# Patient Record
Sex: Female | Born: 1985
Health system: Southern US, Community
[De-identification: ages and names within clinical notes are randomized; demographics above are authoritative.]

## PROBLEM LIST (undated history)

## (undated) DIAGNOSIS — H93A1 Pulsatile tinnitus, right ear: Secondary | ICD-10-CM

## (undated) DIAGNOSIS — K589 Irritable bowel syndrome without diarrhea: Secondary | ICD-10-CM

## (undated) DIAGNOSIS — Z8249 Family history of ischemic heart disease and other diseases of the circulatory system: Secondary | ICD-10-CM

## (undated) DIAGNOSIS — E559 Vitamin D deficiency, unspecified: Secondary | ICD-10-CM

## (undated) HISTORY — DX: Pulsatile tinnitus, right ear: H93.A1

## (undated) HISTORY — DX: Vitamin D deficiency, unspecified: E55.9

## (undated) HISTORY — PX: TOOTH EXTRACTION: SUR596

## (undated) HISTORY — DX: Family history of ischemic heart disease and other diseases of the circulatory system: Z82.49

## (undated) HISTORY — DX: Irritable bowel syndrome, unspecified: K58.9

---
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
## 2007-10-30 ENCOUNTER — Encounter: Admission: RE | Admit: 2007-10-30 | Discharge: 2007-10-30 | Payer: Self-pay | Admitting: Internal Medicine

## 2009-10-25 ENCOUNTER — Emergency Department (HOSPITAL_COMMUNITY): Admission: EM | Admit: 2009-10-25 | Discharge: 2009-10-26 | Payer: Self-pay | Admitting: Emergency Medicine

## 2009-10-27 ENCOUNTER — Emergency Department (HOSPITAL_COMMUNITY): Admission: EM | Admit: 2009-10-27 | Discharge: 2009-10-27 | Payer: Self-pay | Admitting: Emergency Medicine

## 2010-05-16 NOTE — Assessment & Plan Note (Signed)
[facility]                             PULMONARY OFFICE NOTE   NAME:Eads, Norma White                     MRN:          [id number]  DATE:06/03/2006                            DOB:          January 12, 1985    PROBLEM:  An 25 year old woman, seen at the request of her mother, a  patient here, and her primary physician, Dr. Georgann Housekeeper, for  evaluation of possibly allergic rash.   HISTORY:  This young woman has been in, otherwise, good health.  About 2  weeks ago she noted a single bump on the inner aspect of her right  thigh.  Gradually, the rash has extended diffusely over her neck, back,  and chest, and is spreading out her arms.  It is pruritic.  She has been  using antihistamines including Zyrtec and something that was prescribed  for her, possibly Atarax by description.  She has also been applying a  triamcinolone cream of her mother's.  These give a few hours' relief  from itching.  She has never had any similar problem.  She denies fever,  adenopathy, headache, mucosal irritation, or joint pain, and basically  feels well except for the itching.  Neither she nor her mother is aware  of anything unusual that she may have been exposed to.  She had been on  no medications.  Her mother does use fabric softener sheets in the  dryer, and thinks she might have changed brands but does not remember.   REVIEW OF SYSTEMS:  Limited to itching and rash.   PAST HISTORY:  Essentially negative.  No surgeries, no active health  problems, no problems with latex, contrast dye, or aspirin.   SOCIAL HISTORY:  Smoking 1 black a day.  She is a Consulting civil engineer, not  married, no children.   FAMILY HISTORY:  Mother with allergy and asthma problems, and with  arthritis.   OBJECTIVE:  Weight 150, BP 122/88, pulse regular 81, room air saturation  99%.  This is a generally healthy-appearing, young African-American woman with  light skin.  She has a diffuse, papular,  nonerythematous rash with minimal  excoriation which extends diffusely from her neck across the trunk and  back, and to her upper arms.  Lesions are of different sizes, the  largest being, perhaps, a quarter inch, most around an eighth of an inch  in diameter.  It is not apparent that they are of different ages, and  she has not recognized that any were fading.  Lungs are clear.  Heart sounds regular without murmur or gallop.  There is no enlargement of liver or spleen.  Nose and throat seem clear.  No obvious arthritis.  I see no rash on her palms or nail beds.   IMPRESSION:  Rash.  I favor an infectious etiology over allergy.  A  sustained urticarial reaction seems unlikely unless a trigger can be  identified.  Possibilities might include pityriasis and, I suppose, lues  cannot be excluded.  She has a dermatology appointment in 2 days.  I  encouraged them to keep that and chose to defer blood  work such as a  VDRL and CBC for her dermatologist.   PLAN:  1. Try bathing with Aveeno.  2. Continue antihistamines for comfort.  3. Keep dermatology appointment.  4. Return here p.r.n.     Clinton D. Maple Hudson, MD, Tonny Bollman, FACP  Electronically Signed    CDY/MedQ  DD: 06/03/2006  DT: 06/04/2006  Job #: [id number]   cc:   Georgann Housekeeper, MD

## 2012-10-22 IMAGING — CR DG THORACIC SPINE 2V
4 series · 4 of 4 positions shown · non-contrast
Comparison: 12/21/2005

CLINICAL DATA: Back pain.  Patient lifted a dog 2 weeks ago.  Pain
between shoulder blades since then.

THORACIC SPINE - 2 VIEW

[t t-spine a.p.]
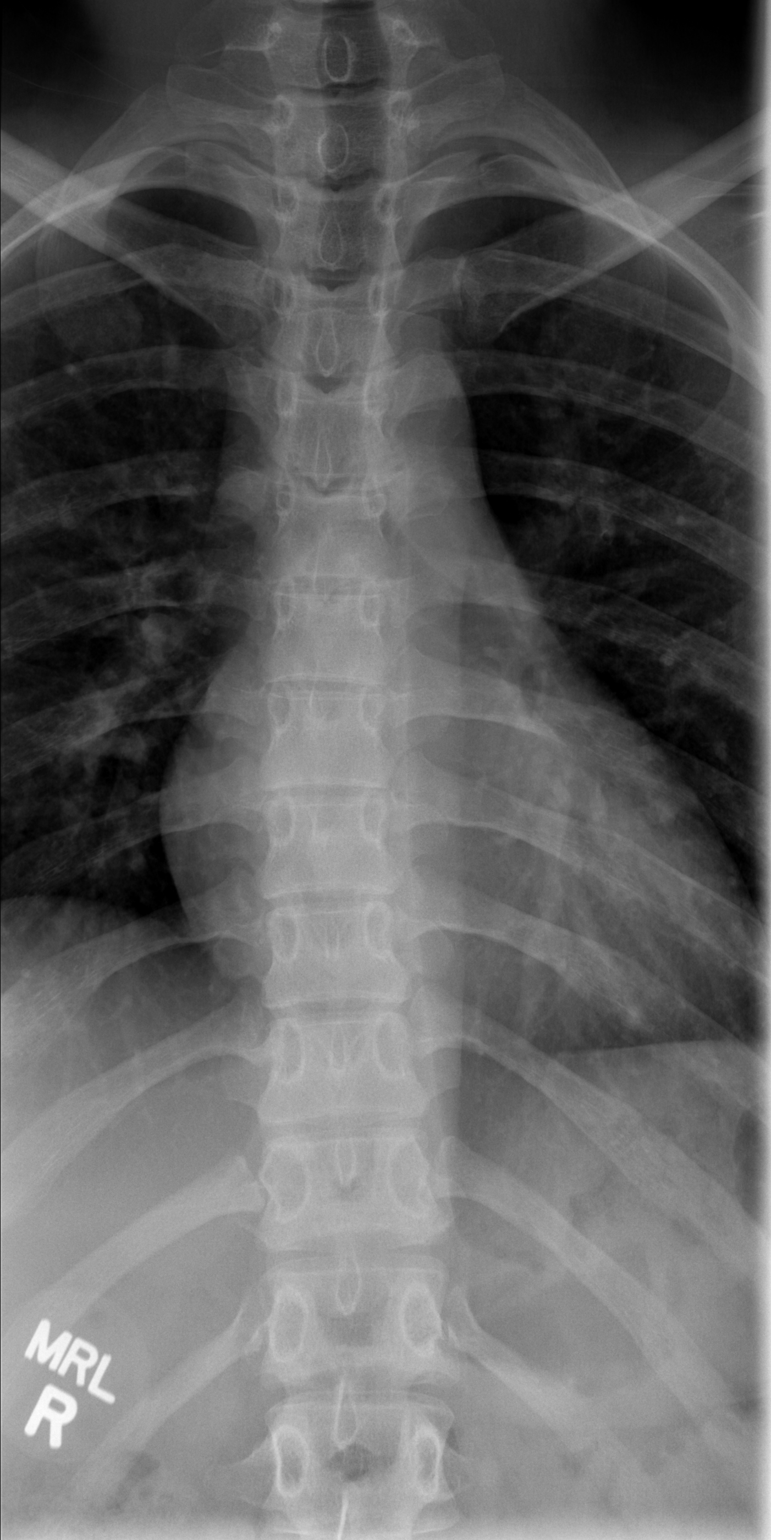

[t t-spine lat (1 of 2)]
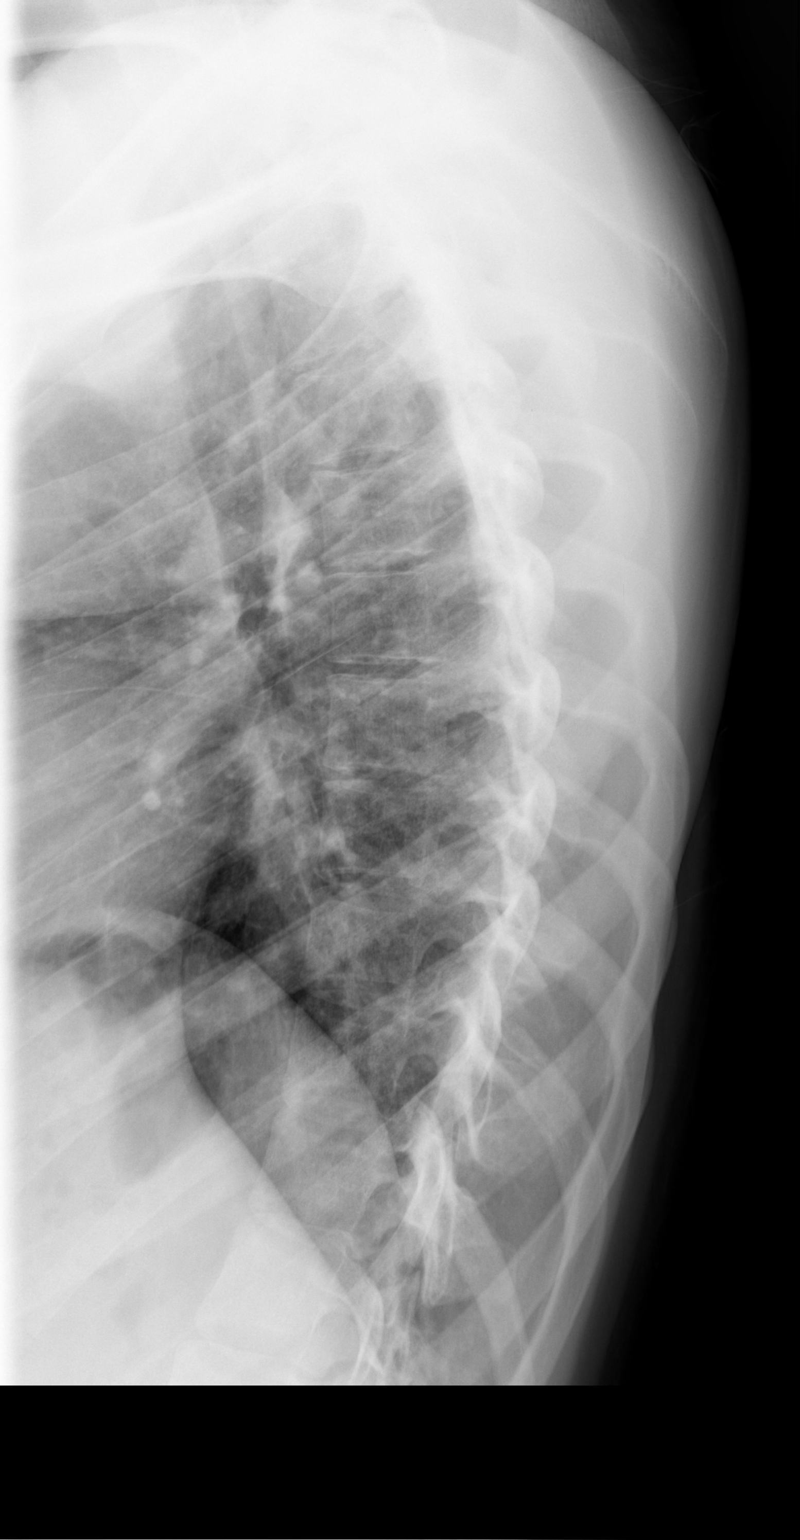

[t t-spine lat (2 of 2)]
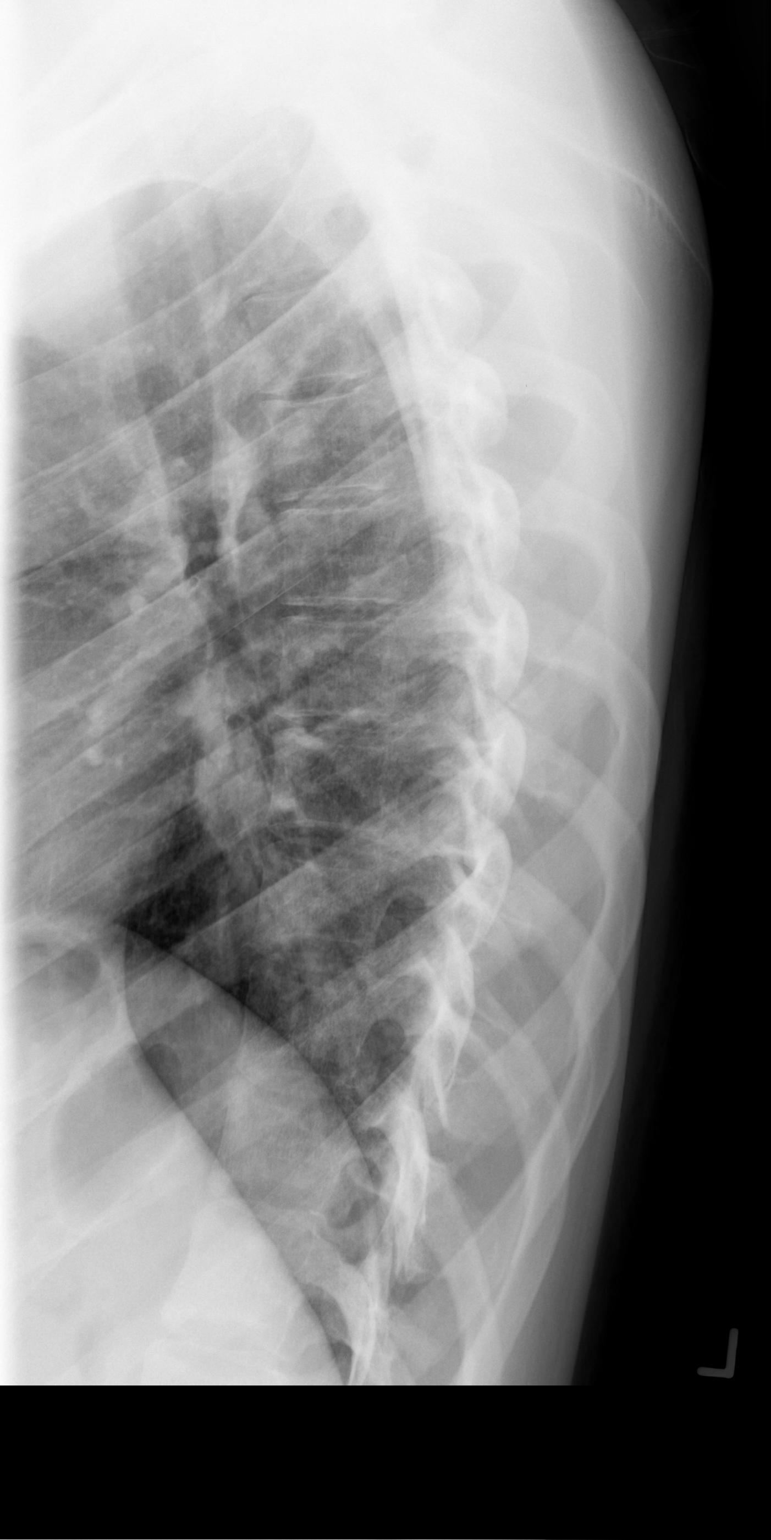

[t swimmers]
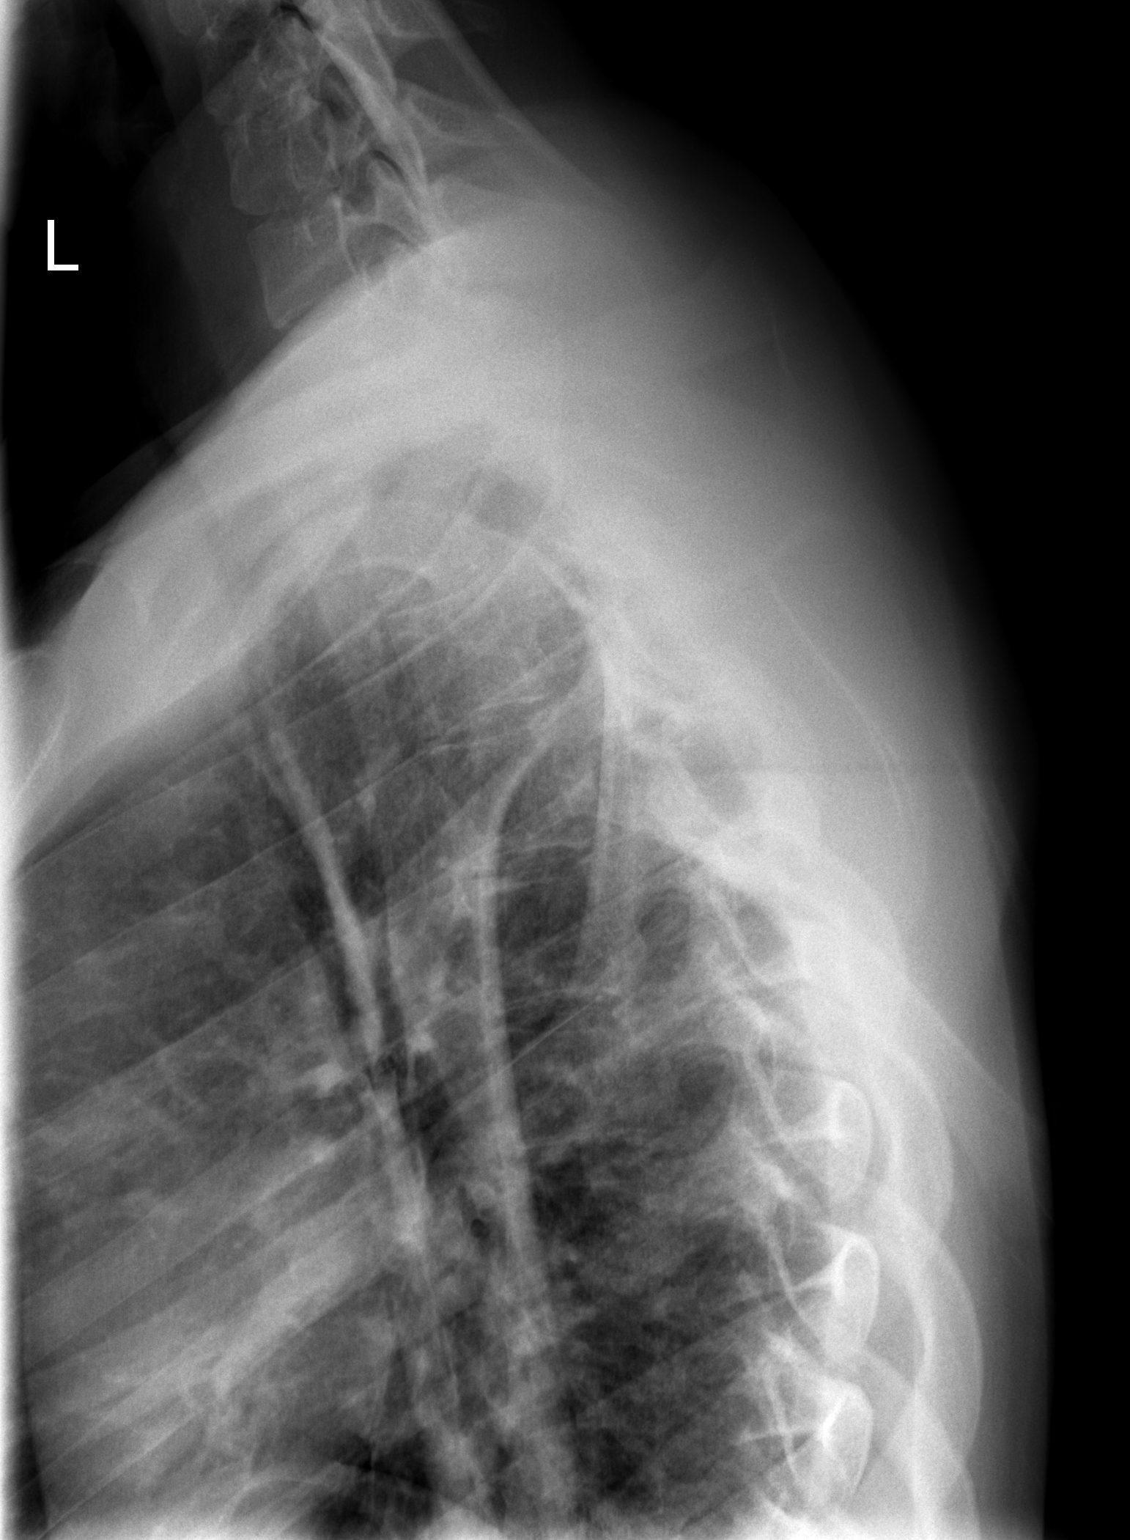

[4 of 4 positions shown; findings below may reference images not displayed]

FINDINGS: There is normal alignment of the thoracic spine.  No
evidence for acute fracture or subluxation.  Paravertebral line has
a normal appearance.  Visualized posterior ribs are unremarkable in
appearance.
IMPRESSION: Negative exam.

## 2016-04-09 DIAGNOSIS — Z6836 Body mass index (BMI) 36.0-36.9, adult: Secondary | ICD-10-CM | POA: Diagnosis not present

## 2016-04-09 DIAGNOSIS — R635 Abnormal weight gain: Secondary | ICD-10-CM | POA: Diagnosis not present

## 2016-04-09 DIAGNOSIS — Z713 Dietary counseling and surveillance: Secondary | ICD-10-CM | POA: Diagnosis not present

## 2016-04-30 DIAGNOSIS — Z713 Dietary counseling and surveillance: Secondary | ICD-10-CM | POA: Diagnosis not present

## 2016-04-30 DIAGNOSIS — Z6835 Body mass index (BMI) 35.0-35.9, adult: Secondary | ICD-10-CM | POA: Diagnosis not present

## 2016-06-04 DIAGNOSIS — Z6834 Body mass index (BMI) 34.0-34.9, adult: Secondary | ICD-10-CM | POA: Diagnosis not present

## 2016-06-04 DIAGNOSIS — Z713 Dietary counseling and surveillance: Secondary | ICD-10-CM | POA: Diagnosis not present

## 2016-07-06 ENCOUNTER — Encounter: Payer: Self-pay | Admitting: Internal Medicine

## 2016-07-06 ENCOUNTER — Ambulatory Visit (INDEPENDENT_AMBULATORY_CARE_PROVIDER_SITE_OTHER): Payer: 59 | Admitting: Internal Medicine

## 2016-07-06 DIAGNOSIS — K580 Irritable bowel syndrome with diarrhea: Secondary | ICD-10-CM

## 2016-07-06 MED ORDER — METRONIDAZOLE 500 MG PO TABS: 500.0000 mg | ORAL_TABLET | Freq: Three times a day (TID) | ORAL | 1 refills | Status: DC

## 2016-07-06 MED ORDER — VITAMIN D3 50 MCG (2000 UT) PO CAPS: 2000.0000 [IU] | ORAL_CAPSULE | Freq: Every day | ORAL | 3 refills | Status: DC

## 2016-07-06 MED ORDER — SACCHAROMYCES BOULARDII 250 MG PO CAPS: 250.0000 mg | ORAL_CAPSULE | Freq: Two times a day (BID) | ORAL | 1 refills | Status: DC

## 2016-07-06 NOTE — Patient Instructions (Addendum)
Gluten free trial (no wheat products) for 4-6 weeks. OK to use gluten-free bread and gluten-free pasta.  Milk free trial (no milk, ice cream, cheese and yogurt) for 4-6 weeks. OK to use almond, coconut, rice or soy milk. "Almond breeze" brand tastes good.  Try Investment banker, operationallorastor or Align after Flagyl course

## 2016-07-06 NOTE — Progress Notes (Signed)
Subjective:  Patient ID: Norma White, female    DOB: January 21, 1985  Age: 31 y.o. MRN: [id number]  CC: No chief complaint on file.   HPI [name] presents for a new pt C/o IBS x years: loose stools. Occ cramps. She has hard time cleaning She tried Metamucil, fiber; lactose-free diet  No outpatient prescriptions prior to visit.   No facility-administered medications prior to visit.     ROS Review of Systems  Constitutional: Negative for activity change, appetite change, chills, fatigue and unexpected weight change.  HENT: Negative for congestion, mouth sores and sinus pressure.   Eyes: Negative for visual disturbance.  Respiratory: Negative for cough and chest tightness.   Gastrointestinal: Positive for abdominal distention, abdominal pain and diarrhea. Negative for nausea.  Genitourinary: Negative for difficulty urinating, frequency and vaginal pain.  Musculoskeletal: Negative for back pain and gait problem.  Skin: Negative for pallor and rash.  Neurological: Negative for dizziness, tremors, weakness, numbness and headaches.  Psychiatric/Behavioral: Negative for confusion and sleep disturbance.    Objective:  BP 116/72 (BP Location: Left Arm, Patient Position: Sitting, Cuff Size: Large)   Pulse 82   Temp 98 F (36.7 C) (Oral)   Ht 5\' 7"  (1.702 m)   Wt 212 lb (96.2 kg)   LMP 06/20/2016 (Approximate)   SpO2 100%   BMI 33.20 kg/m   BP Readings from Last 3 Encounters:  07/06/16 116/72    Wt Readings from Last 3 Encounters:  07/06/16 212 lb (96.2 kg)    Physical Exam  Constitutional: She appears well-developed. No distress.  HENT:  Head: Normocephalic.  Right Ear: External ear normal.  Left Ear: External ear normal.  Nose: Nose normal.  Mouth/Throat: Oropharynx is clear and moist.  Eyes: Pupils are equal, round, and reactive to light. Conjunctivae are normal. Right eye exhibits no discharge. Left eye exhibits no discharge.  Neck: Normal range of motion. Neck  supple. No JVD present. No tracheal deviation present. No thyromegaly present.  Cardiovascular: Normal rate, regular rhythm and normal heart sounds.   Pulmonary/Chest: No stridor. No respiratory distress. She has no wheezes.  Abdominal: Soft. Bowel sounds are normal. She exhibits no distension and no mass. There is no tenderness. There is no rebound and no guarding.  Musculoskeletal: She exhibits no edema or tenderness.  Lymphadenopathy:    She has no cervical adenopathy.  Neurological: She displays normal reflexes. No cranial nerve deficit. She exhibits normal muscle tone. Coordination normal.  Skin: No rash noted. No erythema.  Psychiatric: She has a normal mood and affect. Her behavior is normal. Judgment and thought content normal.    No results found for: WBC, HGB, HCT, PLT, GLUCOSE, CHOL, TRIG, HDL, LDLDIRECT, LDLCALC, ALT, AST, NA, K, CL, CREATININE, BUN, CO2, TSH, PSA, INR, GLUF, HGBA1C, MICROALBUR  Dg Thoracic Spine 1 View  Result Date: 10/27/2009 Clinical Data: Back pain.  Patient lifted a dog 2 weeks ago.  Pain between shoulder blades since then.  THORACIC SPINE - 2 VIEW  Comparison: 12/21/2005  Findings: There is normal alignment of the thoracic spine.  No evidence for acute fracture or subluxation.  Paravertebral line has a normal appearance.  Visualized posterior ribs are unremarkable in appearance.  IMPRESSION: Negative exam. Provider: Sullivan LoneMegan Loy   Assessment & Plan:   There are no diagnoses linked to this encounter. I am having Ms. White maintain her norgestimate-ethinyl estradiol.  Meds ordered this encounter  Medications  . norgestimate-ethinyl estradiol (SPRINTEC 28) 0.25-35 MG-MCG tablet    Sig:  Take 1 tablet by mouth daily.     Follow-up: No Follow-up on file.  Sonda Primes, MD

## 2016-07-09 DIAGNOSIS — L700 Acne vulgaris: Secondary | ICD-10-CM | POA: Diagnosis not present

## 2016-07-23 DIAGNOSIS — K582 Mixed irritable bowel syndrome: Secondary | ICD-10-CM | POA: Diagnosis not present

## 2016-07-30 ENCOUNTER — Ambulatory Visit: Payer: Commercial Managed Care - PPO | Admitting: Internal Medicine

## 2016-09-05 ENCOUNTER — Encounter: Payer: Self-pay | Admitting: Internal Medicine

## 2016-09-05 DIAGNOSIS — K589 Irritable bowel syndrome without diarrhea: Secondary | ICD-10-CM | POA: Insufficient documentation

## 2016-09-05 NOTE — Assessment & Plan Note (Signed)
Gluten free trial (no wheat products) for 4-6 weeks. OK to use gluten-free bread and gluten-free pasta.  Milk free trial (no milk, ice cream, cheese and yogurt) for 4-6 weeks. OK to use almond, coconut, rice or soy milk. "Almond breeze" brand tastes good.  Try Florastor or Align after Flagyl course   GI ref if not better

## 2016-10-25 DIAGNOSIS — Z01419 Encounter for gynecological examination (general) (routine) without abnormal findings: Secondary | ICD-10-CM | POA: Diagnosis not present

## 2016-10-25 DIAGNOSIS — Z6833 Body mass index (BMI) 33.0-33.9, adult: Secondary | ICD-10-CM | POA: Diagnosis not present

## 2017-01-14 ENCOUNTER — Encounter: Payer: Self-pay | Admitting: Internal Medicine

## 2017-01-14 ENCOUNTER — Ambulatory Visit (INDEPENDENT_AMBULATORY_CARE_PROVIDER_SITE_OTHER): Payer: 59 | Admitting: Internal Medicine

## 2017-01-14 ENCOUNTER — Other Ambulatory Visit (INDEPENDENT_AMBULATORY_CARE_PROVIDER_SITE_OTHER): Payer: 59

## 2017-01-14 DIAGNOSIS — K580 Irritable bowel syndrome with diarrhea: Secondary | ICD-10-CM

## 2017-01-14 DIAGNOSIS — Z0000 Encounter for general adult medical examination without abnormal findings: Secondary | ICD-10-CM

## 2017-01-14 LAB — CBC WITH DIFFERENTIAL/PLATELET
BASOS ABS: 0 10*3/uL (ref 0.0–0.1)
BASOS PCT: 0.7 % (ref 0.0–3.0)
Eosinophils Absolute: 0.1 10*3/uL (ref 0.0–0.7)
Eosinophils Relative: 1.4 % (ref 0.0–5.0)
HEMATOCRIT: 39.2 % (ref 36.0–46.0)
Hemoglobin: 12.9 g/dL (ref 12.0–15.0)
LYMPHS ABS: 2.5 10*3/uL (ref 0.7–4.0)
LYMPHS PCT: 39.9 % (ref 12.0–46.0)
MCHC: 32.9 g/dL (ref 30.0–36.0)
MCV: 88.9 fl (ref 78.0–100.0)
Monocytes Absolute: 0.5 10*3/uL (ref 0.1–1.0)
Monocytes Relative: 7.2 % (ref 3.0–12.0)
NEUTROS ABS: 3.2 10*3/uL (ref 1.4–7.7)
NEUTROS PCT: 50.8 % (ref 43.0–77.0)
PLATELETS: 309 10*3/uL (ref 150.0–400.0)
RBC: 4.41 Mil/uL (ref 3.87–5.11)
RDW: 13.8 % (ref 11.5–15.5)
WBC: 6.3 10*3/uL (ref 4.0–10.5)

## 2017-01-14 LAB — BASIC METABOLIC PANEL
BUN: 7 mg/dL (ref 6–23)
CALCIUM: 8.7 mg/dL (ref 8.4–10.5)
CHLORIDE: 107 meq/L (ref 96–112)
CO2: 26 meq/L (ref 19–32)
Creatinine, Ser: 0.61 mg/dL (ref 0.40–1.20)
GFR: 148.72 mL/min (ref >60.00)
GLUCOSE: 84 mg/dL (ref 70–99)
Potassium: 4 mEq/L (ref 3.5–5.1)
SODIUM: 139 meq/L (ref 135–145)

## 2017-01-14 LAB — URINALYSIS
BILIRUBIN URINE: NEGATIVE
HGB URINE DIPSTICK: NEGATIVE
Ketones, ur: NEGATIVE
LEUKOCYTES UA: NEGATIVE
Nitrite: NEGATIVE
PH: 7.5 (ref 5.0–8.0)
Specific Gravity, Urine: 1.02 (ref 1.000–1.030)
Total Protein, Urine: NEGATIVE
UROBILINOGEN UA: 0.2 (ref 0.0–1.0)
Urine Glucose: NEGATIVE

## 2017-01-14 LAB — HEPATIC FUNCTION PANEL
ALK PHOS: 61 U/L (ref 39–117)
ALT: 10 U/L (ref 0–35)
AST: 13 U/L (ref 0–37)
Albumin: 3.7 g/dL (ref 3.5–5.2)
BILIRUBIN TOTAL: 0.3 mg/dL (ref 0.2–1.2)
Bilirubin, Direct: 0.1 mg/dL (ref 0.0–0.3)
Total Protein: 7.3 g/dL (ref 6.0–8.3)

## 2017-01-14 LAB — LIPID PANEL
Cholesterol: 161 mg/dL (ref 0–200)
HDL: 57.1 mg/dL (ref >39.00)
LDL Cholesterol: 92 mg/dL (ref 0–99)
NONHDL: 104.18
Total CHOL/HDL Ratio: 3
Triglycerides: 61 mg/dL (ref 0.0–149.0)
VLDL: 12.2 mg/dL (ref 0.0–40.0)

## 2017-01-14 LAB — TSH: TSH: 0.67 u[IU]/mL (ref 0.35–4.50)

## 2017-01-14 MED ORDER — VITAMIN D3 50 MCG (2000 UT) PO CAPS: 2000.0000 [IU] | ORAL_CAPSULE | Freq: Every day | ORAL | 3 refills | Status: DC

## 2017-01-14 NOTE — Assessment & Plan Note (Signed)
Much better on diet °

## 2017-01-14 NOTE — Progress Notes (Signed)
Subjective:  Patient ID: Norma White, female    DOB: July 12, 1985  Age: 32 y.o. MRN: [id number]  CC: No chief complaint on file.   HPI Norma White presents for a well exam No abd complaints on a better diet  Outpatient Medications Prior to Visit  Medication Sig Dispense Refill  . norgestimate-ethinyl estradiol (SPRINTEC 28) 0.25-35 MG-MCG tablet Take 1 tablet by mouth daily.    . Cholecalciferol (VITAMIN D3) 2000 units capsule Take 1 capsule (2,000 Units total) by mouth daily. (Patient not taking: Reported on 01/14/2017) 100 capsule 3  . metroNIDAZOLE (FLAGYL) 500 MG tablet Take 1 tablet (500 mg total) by mouth 3 (three) times daily. (Patient not taking: Reported on 01/14/2017) 30 tablet 1  . saccharomyces boulardii (FLORASTOR) 250 MG capsule Take 1 capsule (250 mg total) by mouth 2 (two) times daily. Take after you finish Flagyl (Patient not taking: Reported on 01/14/2017) 30 capsule 1   No facility-administered medications prior to visit.     ROS Review of Systems  Constitutional: Positive for unexpected weight change. Negative for activity change, appetite change, chills and fatigue.  HENT: Negative for congestion, mouth sores and sinus pressure.   Eyes: Negative for visual disturbance.  Respiratory: Negative for cough and chest tightness.   Gastrointestinal: Negative for abdominal pain and nausea.  Genitourinary: Negative for difficulty urinating, frequency and vaginal pain.  Musculoskeletal: Negative for back pain and gait problem.  Skin: Negative for pallor and rash.  Neurological: Negative for dizziness, tremors, weakness, numbness and headaches.  Psychiatric/Behavioral: Negative for confusion and sleep disturbance.    Objective:  BP 120/68 (BP Location: Right Arm, Patient Position: Sitting, Cuff Size: Large)   Pulse 62   Temp 98.9 F (37.2 C) (Oral)   Ht 5\' 7"  (1.702 m)   Wt 217 lb (98.4 kg)   SpO2 98%   BMI 33.99 kg/m   BP Readings from Last 3 Encounters:  01/14/17  120/68  07/06/16 116/72    Wt Readings from Last 3 Encounters:  01/14/17 217 lb (98.4 kg)  07/06/16 212 lb (96.2 kg)    Physical Exam  Constitutional: She appears well-developed. No distress.  HENT:  Head: Normocephalic.  Right Ear: External ear normal.  Left Ear: External ear normal.  Nose: Nose normal.  Mouth/Throat: Oropharynx is clear and moist.  Eyes: Conjunctivae are normal. Pupils are equal, round, and reactive to light. Right eye exhibits no discharge. Left eye exhibits no discharge.  Neck: Normal range of motion. Neck supple. No JVD present. No tracheal deviation present. No thyromegaly present.  Cardiovascular: Normal rate, regular rhythm and normal heart sounds.  Pulmonary/Chest: No stridor. No respiratory distress. She has no wheezes.  Abdominal: Soft. Bowel sounds are normal. She exhibits no distension and no mass. There is no tenderness. There is no rebound and no guarding.  Musculoskeletal: She exhibits no edema or tenderness.  Lymphadenopathy:    She has no cervical adenopathy.  Neurological: She displays normal reflexes. No cranial nerve deficit. She exhibits normal muscle tone. Coordination normal.  Skin: No rash noted. No erythema.  Psychiatric: She has a normal mood and affect. Her behavior is normal. Judgment and thought content normal.    No results found for: WBC, HGB, HCT, PLT, GLUCOSE, CHOL, TRIG, HDL, LDLDIRECT, LDLCALC, ALT, AST, NA, K, CL, CREATININE, BUN, CO2, TSH, PSA, INR, GLUF, HGBA1C, MICROALBUR  Dg Thoracic Spine 1 View  Result Date: 10/27/2009 Clinical Data: Back pain.  Patient lifted a dog 2 weeks ago.  Pain between  shoulder blades since then.  THORACIC SPINE - 2 VIEW  Comparison: 12/21/2005  Findings: There is normal alignment of the thoracic spine.  No evidence for acute fracture or subluxation.  Paravertebral line has a normal appearance.  Visualized posterior ribs are unremarkable in appearance.  IMPRESSION: Negative exam. Provider: Sullivan Lone   Assessment & Plan:   There are no diagnoses linked to this encounter. I have discontinued Komal White's metroNIDAZOLE, Vitamin D3, and saccharomyces boulardii. I am also having her maintain her norgestimate-ethinyl estradiol.  No orders of the defined types were placed in this encounter.    Follow-up: No Follow-up on file.  Sonda Primes, MD

## 2017-01-14 NOTE — Assessment & Plan Note (Signed)
We discussed age appropriate health related issues, including available/recomended screening tests and vaccinations. We discussed a need for adhering to healthy diet and exercise. Labs were ordered to be later reviewed . All questions were answered.   

## 2017-01-14 NOTE — Patient Instructions (Signed)
  SebaMed soap    

## 2017-01-18 ENCOUNTER — Telehealth: Payer: Self-pay

## 2017-01-18 NOTE — Telephone Encounter (Signed)
Pt would like to know if there is something OTC that she can take for appetite suppressant/craving control. Please advise

## 2017-01-18 NOTE — Telephone Encounter (Signed)
Try a green tea extract - small bottle Thx

## 2017-01-18 NOTE — Telephone Encounter (Signed)
Pt.notified

## 2017-03-05 ENCOUNTER — Telehealth: Payer: Self-pay

## 2017-03-05 MED ORDER — CRISABOROLE 2 % EX OINT: 1.0000 "application " | TOPICAL_OINTMENT | Freq: Two times a day (BID) | CUTANEOUS | 3 refills | Status: DC

## 2017-03-05 NOTE — Telephone Encounter (Signed)
Patient came in today and would like to know if she can get a RX for [place]. She has been having red, itchy spots on her neck and back. She has tried coco butter and dry skin lotion but it has not helped, please advise.

## 2017-03-05 NOTE — Telephone Encounter (Signed)
Ok  - hope it is covered Visual merchandiserThx

## 2017-03-07 ENCOUNTER — Telehealth: Payer: Self-pay

## 2017-03-07 NOTE — Telephone Encounter (Signed)
Key: [id number]  Submitted today on Cover My Meds. Auth faxed to insurance pending approval.

## 2017-03-15 DIAGNOSIS — L249 Irritant contact dermatitis, unspecified cause: Secondary | ICD-10-CM | POA: Diagnosis not present

## 2017-03-15 DIAGNOSIS — L700 Acne vulgaris: Secondary | ICD-10-CM | POA: Diagnosis not present

## 2017-10-30 DIAGNOSIS — Z6834 Body mass index (BMI) 34.0-34.9, adult: Secondary | ICD-10-CM | POA: Diagnosis not present

## 2017-10-30 DIAGNOSIS — Z01419 Encounter for gynecological examination (general) (routine) without abnormal findings: Secondary | ICD-10-CM | POA: Diagnosis not present

## 2017-11-11 ENCOUNTER — Other Ambulatory Visit (INDEPENDENT_AMBULATORY_CARE_PROVIDER_SITE_OTHER): Payer: 59

## 2017-11-11 ENCOUNTER — Other Ambulatory Visit: Payer: Self-pay

## 2017-11-11 DIAGNOSIS — R8290 Unspecified abnormal findings in urine: Secondary | ICD-10-CM

## 2017-11-11 LAB — URINALYSIS, ROUTINE W REFLEX MICROSCOPIC
Bilirubin Urine: NEGATIVE
Hgb urine dipstick: NEGATIVE
Ketones, ur: NEGATIVE
Leukocytes, UA: NEGATIVE
Nitrite: NEGATIVE
SPECIFIC GRAVITY, URINE: 1.01 (ref 1.000–1.030)
Total Protein, Urine: NEGATIVE
URINE GLUCOSE: NEGATIVE
UROBILINOGEN UA: 1 (ref 0.0–1.0)
pH: 7.5 (ref 5.0–8.0)

## 2017-11-13 ENCOUNTER — Other Ambulatory Visit: Payer: Self-pay | Admitting: Internal Medicine

## 2017-11-13 LAB — URINE CULTURE
MICRO NUMBER:: 91355125
SPECIMEN QUALITY:: ADEQUATE

## 2017-11-13 MED ORDER — CEFUROXIME AXETIL 250 MG PO TABS: 250.0000 mg | ORAL_TABLET | Freq: Two times a day (BID) | ORAL | 1 refills | Status: AC

## 2017-11-13 MED FILL — CEFUROXIME AXETIL 250 MG TA: 250 | 4 days supply | Qty: 8 | Fill #0

## 2017-11-21 ENCOUNTER — Other Ambulatory Visit: Payer: Self-pay | Admitting: Internal Medicine

## 2017-11-21 MED ORDER — CEPHALEXIN 500 MG PO CAPS: 1000.0000 mg | ORAL_CAPSULE | Freq: Two times a day (BID) | ORAL | 0 refills | Status: AC

## 2017-11-21 MED FILL — CEPHALEXIN 500 MG CAPSULE: 500 | 7 days supply | Qty: 28 | Fill #0

## 2017-11-21 NOTE — Progress Notes (Signed)
Deneise, [employer]'ll email a Rx for Keflex. Feel better! AP ===View-only below this line===  ----- Message ----- From: Roney MansGay, Shaniya, CMA Sent: 11/21/2017   7:30 AM EST To: Tresa GarterAleksei Emme Rosenau V, MD  Morning!  Can you send in a stronger antibiotic for me. The Ceftin didn't completely clear up the bladder infection. Thanks!

## 2017-11-25 ENCOUNTER — Telehealth: Payer: Self-pay

## 2017-11-25 MED ORDER — FLUCONAZOLE 150 MG PO TABS: 150.0000 mg | ORAL_TABLET | Freq: Once | ORAL | 1 refills | Status: AC

## 2017-11-25 MED FILL — FLUCONAZOLE 150 MG TABS: 150 | 1 days supply | Qty: 1 | Fill #0

## 2017-11-25 NOTE — Telephone Encounter (Signed)
Done. Thx.

## 2017-11-25 NOTE — Telephone Encounter (Signed)
Pt states she needs something for a yeast infection now. Got one over the weekend.  Please advise

## 2017-11-26 NOTE — Telephone Encounter (Signed)
Pt.notified

## 2018-01-16 ENCOUNTER — Other Ambulatory Visit (INDEPENDENT_AMBULATORY_CARE_PROVIDER_SITE_OTHER): Payer: 59

## 2018-01-16 ENCOUNTER — Encounter: Payer: Self-pay | Admitting: Internal Medicine

## 2018-01-16 ENCOUNTER — Ambulatory Visit (INDEPENDENT_AMBULATORY_CARE_PROVIDER_SITE_OTHER): Payer: 59 | Admitting: Internal Medicine

## 2018-01-16 DIAGNOSIS — Z0000 Encounter for general adult medical examination without abnormal findings: Secondary | ICD-10-CM

## 2018-01-16 LAB — CBC WITH DIFFERENTIAL/PLATELET
BASOS PCT: 1.5 % (ref 0.0–3.0)
Basophils Absolute: 0.1 10*3/uL (ref 0.0–0.1)
EOS PCT: 1.8 % (ref 0.0–5.0)
Eosinophils Absolute: 0.1 10*3/uL (ref 0.0–0.7)
HEMATOCRIT: 39.9 % (ref 36.0–46.0)
HEMOGLOBIN: 13.4 g/dL (ref 12.0–15.0)
Lymphocytes Relative: 44.6 % (ref 12.0–46.0)
Lymphs Abs: 2.1 10*3/uL (ref 0.7–4.0)
MCHC: 33.5 g/dL (ref 30.0–36.0)
MCV: 87.5 fl (ref 78.0–100.0)
MONO ABS: 0.3 10*3/uL (ref 0.1–1.0)
MONOS PCT: 6.8 % (ref 3.0–12.0)
Neutro Abs: 2.1 10*3/uL (ref 1.4–7.7)
Neutrophils Relative %: 45.3 % (ref 43.0–77.0)
Platelets: 287 10*3/uL (ref 150.0–400.0)
RBC: 4.56 Mil/uL (ref 3.87–5.11)
RDW: 13.8 % (ref 11.5–15.5)
WBC: 4.7 10*3/uL (ref 4.0–10.5)

## 2018-01-16 LAB — HEPATIC FUNCTION PANEL
ALT: 21 U/L (ref 0–35)
AST: 21 U/L (ref 0–37)
Albumin: 3.9 g/dL (ref 3.5–5.2)
Alkaline Phosphatase: 54 U/L (ref 39–117)
Bilirubin, Direct: 0.1 mg/dL (ref 0.0–0.3)
TOTAL PROTEIN: 7.5 g/dL (ref 6.0–8.3)
Total Bilirubin: 0.5 mg/dL (ref 0.2–1.2)

## 2018-01-16 LAB — BASIC METABOLIC PANEL
BUN: 11 mg/dL (ref 6–23)
CALCIUM: 9.1 mg/dL (ref 8.4–10.5)
CO2: 22 mEq/L (ref 19–32)
Chloride: 106 mEq/L (ref 96–112)
Creatinine, Ser: 0.75 mg/dL (ref 0.40–1.20)
GFR: 116.37 mL/min (ref >60.00)
Glucose, Bld: 100 mg/dL — ABNORMAL HIGH (ref 70–99)
POTASSIUM: 3.7 meq/L (ref 3.5–5.1)
SODIUM: 138 meq/L (ref 135–145)

## 2018-01-16 LAB — TSH: TSH: 0.81 u[IU]/mL (ref 0.35–4.50)

## 2018-01-16 LAB — LIPID PANEL
Cholesterol: 172 mg/dL (ref 0–200)
HDL: 58.6 mg/dL
LDL Cholesterol: 101 mg/dL — ABNORMAL HIGH (ref 0–99)
NonHDL: 113.54
Total CHOL/HDL Ratio: 3
Triglycerides: 62 mg/dL (ref 0.0–149.0)
VLDL: 12.4 mg/dL (ref 0.0–40.0)

## 2018-01-16 NOTE — Progress Notes (Signed)
Subjective:  Patient ID: Norma White, female    DOB: 1985/02/18  Age: 33 y.o. MRN: [id number]  CC: No chief complaint on file.   HPI [name] presents for well exam  Outpatient Medications Prior to Visit  Medication Sig Dispense Refill  . Cholecalciferol (VITAMIN D3) 2000 units capsule Take 1 capsule (2,000 Units total) by mouth daily. 100 capsule 3  . Crisaborole (EUCRISA) 2 % OINT Apply 1 application topically 2 (two) times daily. 60 g 3  . norgestimate-ethinyl estradiol (SPRINTEC 28) 0.25-35 MG-MCG tablet Take 1 tablet by mouth daily.     No facility-administered medications prior to visit.     ROS: Review of Systems  Constitutional: Negative for activity change, appetite change, chills, fatigue and unexpected weight change.  HENT: Negative for congestion, mouth sores and sinus pressure.   Eyes: Negative for visual disturbance.  Respiratory: Negative for cough and chest tightness.   Gastrointestinal: Negative for abdominal pain and nausea.  Genitourinary: Negative for difficulty urinating, frequency and vaginal pain.  Musculoskeletal: Negative for back pain and gait problem.  Skin: Negative for pallor and rash.  Neurological: Negative for dizziness, tremors, weakness, numbness and headaches.  Psychiatric/Behavioral: Negative for confusion and sleep disturbance.    Objective:  BP 122/74 (BP Location: Left Arm, Patient Position: Sitting, Cuff Size: Large)   Pulse 90   Temp 98.6 F (37 C) (Oral)   Ht 5\' 7"  (1.702 m)   Wt 210 lb (95.3 kg)   SpO2 97%   BMI 32.89 kg/m   BP Readings from Last 3 Encounters:  01/16/18 122/74  01/14/17 120/68  07/06/16 116/72    Wt Readings from Last 3 Encounters:  01/16/18 210 lb (95.3 kg)  01/14/17 217 lb (98.4 kg)  07/06/16 212 lb (96.2 kg)    Physical Exam Constitutional:      General: She is not in acute distress.    Appearance: She is well-developed.  HENT:     Head: Normocephalic.     Right Ear: External ear normal.       Left Ear: External ear normal.     Nose: Nose normal.  Eyes:     General:        Right eye: No discharge.        Left eye: No discharge.     Conjunctiva/sclera: Conjunctivae normal.     Pupils: Pupils are equal, round, and reactive to light.  Neck:     Musculoskeletal: Normal range of motion and neck supple.     Thyroid: No thyromegaly.     Vascular: No JVD.     Trachea: No tracheal deviation.  Cardiovascular:     Rate and Rhythm: Normal rate and regular rhythm.     Heart sounds: Normal heart sounds.  Pulmonary:     Effort: No respiratory distress.     Breath sounds: No stridor. No wheezing.  Abdominal:     General: Bowel sounds are normal. There is no distension.     Palpations: Abdomen is soft. There is no mass.     Tenderness: There is no abdominal tenderness. There is no guarding or rebound.  Musculoskeletal:        General: No tenderness.  Lymphadenopathy:     Cervical: No cervical adenopathy.  Skin:    Findings: No erythema or rash.  Neurological:     Cranial Nerves: No cranial nerve deficit.     Motor: No abnormal muscle tone.     Coordination: Coordination normal.  Deep Tendon Reflexes: Reflexes normal.  Psychiatric:        Behavior: Behavior normal.        Thought Content: Thought content normal.        Judgment: Judgment normal.   neck, thyroid WNL  Lab Results  Component Value Date   WBC 6.3 01/14/2017   HGB 12.9 01/14/2017   HCT 39.2 01/14/2017   PLT 309.0 01/14/2017   GLUCOSE 84 01/14/2017   CHOL 161 01/14/2017   TRIG 61.0 01/14/2017   HDL 57.10 01/14/2017   LDLCALC 92 01/14/2017   ALT 10 01/14/2017   AST 13 01/14/2017   NA 139 01/14/2017   K 4.0 01/14/2017   CL 107 01/14/2017   CREATININE 0.61 01/14/2017   BUN 7 01/14/2017   CO2 26 01/14/2017   TSH 0.67 01/14/2017    Dg Thoracic Spine 1 View  Result Date: 10/27/2009 Clinical Data: Back pain.  Patient lifted a dog 2 weeks ago.  Pain between shoulder blades since then.  THORACIC  SPINE - 2 VIEW  Comparison: 12/21/2005  Findings: There is normal alignment of the thoracic spine.  No evidence for acute fracture or subluxation.  Paravertebral line has a normal appearance.  Visualized posterior ribs are unremarkable in appearance.  IMPRESSION: Negative exam. Provider: Sullivan Lone   Assessment & Plan:   There are no diagnoses linked to this encounter.   No orders of the defined types were placed in this encounter.    Follow-up: No follow-ups on file.  Sonda Primes, MD

## 2018-01-16 NOTE — Assessment & Plan Note (Signed)
We discussed age appropriate health related issues, including available/recomended screening tests and vaccinations. We discussed a need for adhering to healthy diet and exercise. Labs were ordered to be later reviewed . All questions were answered.  tDAP record will be obtained

## 2018-02-11 DIAGNOSIS — H52221 Regular astigmatism, right eye: Secondary | ICD-10-CM | POA: Diagnosis not present

## 2018-02-11 DIAGNOSIS — H5203 Hypermetropia, bilateral: Secondary | ICD-10-CM | POA: Diagnosis not present

## 2018-03-04 ENCOUNTER — Other Ambulatory Visit: Payer: Self-pay

## 2018-03-04 ENCOUNTER — Other Ambulatory Visit (INDEPENDENT_AMBULATORY_CARE_PROVIDER_SITE_OTHER): Payer: 59

## 2018-03-04 ENCOUNTER — Other Ambulatory Visit: Payer: Self-pay | Admitting: Internal Medicine

## 2018-03-04 DIAGNOSIS — R8290 Unspecified abnormal findings in urine: Secondary | ICD-10-CM

## 2018-03-04 LAB — URINALYSIS, ROUTINE W REFLEX MICROSCOPIC
Bilirubin Urine: NEGATIVE
HGB URINE DIPSTICK: NEGATIVE
Ketones, ur: NEGATIVE
LEUKOCYTE UA: NEGATIVE
Nitrite: NEGATIVE
SPECIFIC GRAVITY, URINE: 1.02 (ref 1.000–1.030)
TOTAL PROTEIN, URINE-UPE24: NEGATIVE
URINE GLUCOSE: NEGATIVE
UROBILINOGEN UA: 1 (ref 0.0–1.0)
pH: 6 (ref 5.0–8.0)

## 2018-03-04 MED ORDER — CEFUROXIME AXETIL 250 MG PO TABS: 250.0000 mg | ORAL_TABLET | Freq: Two times a day (BID) | ORAL | 1 refills | Status: DC

## 2018-03-05 ENCOUNTER — Other Ambulatory Visit: Payer: Self-pay | Admitting: Internal Medicine

## 2018-03-05 MED ORDER — CEPHALEXIN 500 MG PO CAPS: 1000.0000 mg | ORAL_CAPSULE | Freq: Two times a day (BID) | ORAL | 1 refills | Status: DC

## 2018-03-05 MED FILL — CEPHALEXIN 500 MG CAPSULE: 500 | 4 days supply | Qty: 16 | Fill #0

## 2018-03-06 LAB — URINE CULTURE
MICRO NUMBER:: 269691
SPECIMEN QUALITY:: ADEQUATE

## 2018-03-11 ENCOUNTER — Other Ambulatory Visit: Payer: Self-pay | Admitting: Internal Medicine

## 2018-03-11 MED ORDER — FLUCONAZOLE 150 MG PO TABS: 150.0000 mg | ORAL_TABLET | Freq: Once | ORAL | 0 refills | Status: AC

## 2018-03-11 MED FILL — FLUCONAZOLE 150 MG TABS: 150 | 1 days supply | Qty: 1 | Fill #0

## 2018-03-20 MED FILL — FEMYNOR 0.25-35 MG-MCG TABS: 0.25-35 | 28 days supply | Qty: 28 | Fill #0

## 2018-04-03 MED FILL — FEMYNOR 0.25-35 MG-MCG TABS: 0.25-35 | 84 days supply | Qty: 84 | Fill #1

## 2018-04-28 MED FILL — CEPHALEXIN 500 MG CAPSULE: 500 | 4 days supply | Qty: 16 | Fill #1

## 2018-06-03 ENCOUNTER — Other Ambulatory Visit: Payer: Self-pay | Admitting: Internal Medicine

## 2018-06-04 ENCOUNTER — Other Ambulatory Visit: Payer: Self-pay

## 2018-06-04 MED ORDER — BENZOYL PEROXIDE 6 % EX MISC: CUTANEOUS | 11 refills | Status: DC

## 2018-06-18 ENCOUNTER — Ambulatory Visit: Payer: Self-pay

## 2018-06-18 ENCOUNTER — Other Ambulatory Visit: Payer: Self-pay

## 2018-06-18 ENCOUNTER — Ambulatory Visit (INDEPENDENT_AMBULATORY_CARE_PROVIDER_SITE_OTHER): Payer: 59 | Admitting: Family Medicine

## 2018-06-18 ENCOUNTER — Encounter: Payer: Self-pay | Admitting: Family Medicine

## 2018-06-18 VITALS — BP 116/74 | HR 69 | Ht 67.0 in

## 2018-06-18 DIAGNOSIS — S62121A Displaced fracture of lunate [semilunar], right wrist, initial encounter for closed fracture: Secondary | ICD-10-CM | POA: Diagnosis not present

## 2018-06-18 DIAGNOSIS — M25531 Pain in right wrist: Secondary | ICD-10-CM

## 2018-06-18 NOTE — Assessment & Plan Note (Signed)
Mostly subluxation with no apparent fracture actually noted.  Responding well long to manipulation.  Discussed topical anti-inflammatories, icing regimen, bracing and taping.  Follow-up next week if not improved

## 2018-06-18 NOTE — Progress Notes (Signed)
[facility] [place], [place] [address] Phone: [phone] Subjective:   Fontaine No, am serving as a scribe for Dr. Hulan Saas.   CC: Right wrist pain  [id number]  Norma White is a 33 y.o. female coming in with complaint of right wrist pain since yesterday. Said she lifted a cast iron skillet. Did feel a pop. Pain over medial wrist but can radiate throughout the entire wrist. Painful with use.  Patient denies any swelling or bruising.  More of a chronic ache.      Past Medical History:  Diagnosis Date  . IBS (irritable bowel syndrome)    Past Surgical History:  Procedure Laterality Date  . TOOTH EXTRACTION     Social History   Socioeconomic History  . Marital status: Married    Spouse name: Not on file  . Number of children: Not on file  . Years of education: Not on file  . Highest education level: Not on file  Occupational History  . Not on file  Social Needs  . Financial resource strain: Not on file  . Food insecurity    Worry: Not on file    Inability: Not on file  . Transportation needs    Medical: Not on file    Non-medical: Not on file  Tobacco Use  . Smoking status: Light Tobacco Smoker  . Smokeless tobacco: Never Used  Substance and Sexual Activity  . Alcohol use: Yes  . Drug use: No  . Sexual activity: Yes    Partners: Male    Birth control/protection: Pill  Lifestyle  . Physical activity    Days per week: Not on file    Minutes per session: Not on file  . Stress: Not on file  Relationships  . Social Herbalist on phone: Not on file    Gets together: Not on file    Attends religious service: Not on file    Active member of club or organization: Not on file    Attends meetings of clubs or organizations: Not on file    Relationship status: Not on file  Other Topics Concern  . Not on file  Social History Narrative  . Not on file   No Known Allergies Family History  Problem  Relation Age of Onset  . Heart attack Mother   . Heart disease Mother   . Hypertension Mother   . Sjogren's syndrome Mother   . Lupus Mother   . GER disease Mother   . Arthritis Mother   . Hypertension Father   . Leukemia Maternal Grandmother   . Epilepsy Maternal Grandfather     Current Outpatient Medications (Endocrine & Metabolic):  .  norgestimate-ethinyl estradiol (SPRINTEC 28) 0.25-35 MG-MCG tablet, Take 1 tablet by mouth daily.      Current Outpatient Medications (Other):  .  Benzoyl Peroxide (BENZEPRO FOAMING CLOTHS) 6 % MISC, Use 1 each BID .  cephALEXin (KEFLEX) 500 MG capsule, Take 2 capsules (1,000 mg total) by mouth 2 (two) times daily. .  Cholecalciferol (VITAMIN D3) 2000 units capsule, Take 1 capsule (2,000 Units total) by mouth daily.    Past medical history, social, surgical and family history all reviewed in electronic medical record.  No pertanent information unless stated regarding to the chief complaint.   Review of Systems:  No headache, visual changes, nausea, vomiting, diarrhea, constipation, dizziness, abdominal pain, skin rash, fevers, chills, night sweats, weight loss, swollen lymph nodes, body aches,  joint swelling, chest pain, shortness of breath, mood changes.  Positive muscle aches  Objective  Blood pressure 116/74, pulse 69, height 5\' 7"  (1.702 m), SpO2 99 %.    General: No apparent distress alert and oriented x3 mood and affect normal, dressed appropriately.  HEENT: Pupils equal, extraocular movements intact  Respiratory: Patient's speak in full sentences and does not appear short of breath  Cardiovascular: No lower extremity edema, non tender, no erythema  Skin: Warm dry intact with no signs of infection or rash on extremities or on axial skeleton.  Abdomen: Soft nontender  Neuro: Cranial nerves II through XII are intact, neurovascularly intact in all extremities with 2+ DTRs and 2+ pulses.  Lymph: No lymphadenopathy of posterior or  anterior cervical chain or axillae bilaterally.  Gait normal with good balance and coordination.  MSK:  Non tender with full range of motion and good stability and symmetric strength and tone of shoulders, elbows, , hip, knee and ankles bilaterally.  Right wrist exam has no significant bruising at this time.  Patient does have some pain over the ulnar aspect of the wrist but also over the lunate bone itself.  Seems to have pain with dorsiflexion.  Good grip strength.  No pain over this scaphoid  Limited musculoskeletal ultrasound was performed and interpreted by Judi SaaZachary M Smith  Limited ultrasound of the right wrist shows the patient may have a subluxation of the lunate noted.  No true though fracture appreciated.  Mild subluxation of the ECU.  Forearm looks unremarkable. Impression: Lunate subluxation   Impression and Recommendations:     This case required medical decision making of moderate complexity. The above documentation has been reviewed and is accurate and complete Judi SaaZachary M Smith, DO       Note: This dictation was prepared with Dragon dictation along with smaller phrase technology. Any transcriptional errors that result from this process are unintentional.

## 2018-06-18 NOTE — Patient Instructions (Signed)
Good to see you  Ice is your friend pennsaid pinkie amount topically 2 times daily as needed.  Wear brace day and night for a week maybe at most.  If not brace tape the wrist like we showed you  Check in with me on Monday

## 2018-08-05 DIAGNOSIS — N911 Secondary amenorrhea: Secondary | ICD-10-CM | POA: Diagnosis not present

## 2018-08-11 DIAGNOSIS — Z3685 Encounter for antenatal screening for Streptococcus B: Secondary | ICD-10-CM | POA: Diagnosis not present

## 2018-08-11 DIAGNOSIS — Z3481 Encounter for supervision of other normal pregnancy, first trimester: Secondary | ICD-10-CM | POA: Diagnosis not present

## 2018-08-11 LAB — OB RESULTS CONSOLE GC/CHLAMYDIA
Chlamydia: NEGATIVE
Gonorrhea: NEGATIVE

## 2018-08-11 LAB — OB RESULTS CONSOLE HIV ANTIBODY (ROUTINE TESTING): HIV: NONREACTIVE

## 2018-08-11 LAB — OB RESULTS CONSOLE ABO/RH: RH Type: POSITIVE

## 2018-08-11 LAB — OB RESULTS CONSOLE HEPATITIS B SURFACE ANTIGEN: Hepatitis B Surface Ag: NEGATIVE

## 2018-08-11 LAB — OB RESULTS CONSOLE RUBELLA ANTIBODY, IGM: Rubella: IMMUNE

## 2018-08-11 LAB — OB RESULTS CONSOLE ANTIBODY SCREEN: Antibody Screen: NEGATIVE

## 2018-08-11 LAB — OB RESULTS CONSOLE RPR: RPR: NONREACTIVE

## 2018-08-28 DIAGNOSIS — Z113 Encounter for screening for infections with a predominantly sexual mode of transmission: Secondary | ICD-10-CM | POA: Diagnosis not present

## 2018-08-28 DIAGNOSIS — Z124 Encounter for screening for malignant neoplasm of cervix: Secondary | ICD-10-CM | POA: Diagnosis not present

## 2018-08-28 DIAGNOSIS — Z3401 Encounter for supervision of normal first pregnancy, first trimester: Secondary | ICD-10-CM | POA: Diagnosis not present

## 2018-08-28 DIAGNOSIS — Z331 Pregnant state, incidental: Secondary | ICD-10-CM | POA: Diagnosis not present

## 2018-09-01 MED FILL — CLINDAMYCIN 2% VAGINAL CRM: 2 | 8 days supply | Qty: 40 | Fill #0

## 2018-10-21 DIAGNOSIS — Z3400 Encounter for supervision of normal first pregnancy, unspecified trimester: Secondary | ICD-10-CM | POA: Diagnosis not present

## 2018-10-21 DIAGNOSIS — Z363 Encounter for antenatal screening for malformations: Secondary | ICD-10-CM | POA: Diagnosis not present

## 2018-10-21 DIAGNOSIS — Z3A18 18 weeks gestation of pregnancy: Secondary | ICD-10-CM | POA: Diagnosis not present

## 2018-11-25 ENCOUNTER — Ambulatory Visit (INDEPENDENT_AMBULATORY_CARE_PROVIDER_SITE_OTHER): Payer: 59 | Admitting: Family Medicine

## 2018-11-25 ENCOUNTER — Ambulatory Visit: Payer: Self-pay

## 2018-11-25 ENCOUNTER — Encounter: Payer: Self-pay | Admitting: Family Medicine

## 2018-11-25 VITALS — BP 120/82 | HR 98 | Ht 67.0 in | Wt 236.0 lb

## 2018-11-25 DIAGNOSIS — G5601 Carpal tunnel syndrome, right upper limb: Secondary | ICD-10-CM | POA: Diagnosis not present

## 2018-11-25 DIAGNOSIS — M25531 Pain in right wrist: Secondary | ICD-10-CM

## 2018-11-25 NOTE — Patient Instructions (Signed)
Good to see you Brace at night See me if you need injection

## 2018-11-25 NOTE — Progress Notes (Signed)
[facility] [address] [place], [place] [address] Phone: [phone] Subjective:    This visit occurred during the SARS-CoV-2 public health emergency.  Safety protocols were in place, including screening questions prior to the visit, additional usage of staff PPE, and extensive cleaning of exam room while observing appropriate contact time as indicated for disinfecting solutions.     CC: Right wrist pain  [id number]   06/18/2018 Mostly subluxation with no apparent fracture actually noted.  Responding well long to manipulation.  Discussed topical anti-inflammatories, icing regimen, bracing and taping.  Follow-up next week if not improved  11/25/2018 Norma White is a 33 y.o. female coming in with complaint of right wrist pain. States she has a tingling sensation in the wrist. Some weakness. Has a wrist brace.   Patient is noticing more tingling recently.  Patient states that it has been some somewhat severe.  Patient is pregnant.  Does not remember any true injury recently.    Past Medical History:  Diagnosis Date  . IBS (irritable bowel syndrome)    Past Surgical History:  Procedure Laterality Date  . TOOTH EXTRACTION     Social History   Socioeconomic History  . Marital status: Married    Spouse name: Not on file  . Number of children: Not on file  . Years of education: Not on file  . Highest education level: Not on file  Occupational History  . Not on file  Social Needs  . Financial resource strain: Not on file  . Food insecurity    Worry: Not on file    Inability: Not on file  . Transportation needs    Medical: Not on file    Non-medical: Not on file  Tobacco Use  . Smoking status: Light Tobacco Smoker  . Smokeless tobacco: Never Used  Substance and Sexual Activity  . Alcohol use: Yes  . Drug use: No  . Sexual activity: Yes    Partners: Male    Birth control/protection: Pill  Lifestyle  . Physical activity    Days per  week: Not on file    Minutes per session: Not on file  . Stress: Not on file  Relationships  . Social Musician on phone: Not on file    Gets together: Not on file    Attends religious service: Not on file    Active member of club or organization: Not on file    Attends meetings of clubs or organizations: Not on file    Relationship status: Not on file  Other Topics Concern  . Not on file  Social History Narrative  . Not on file   No Known Allergies Family History  Problem Relation Age of Onset  . Heart attack Mother   . Heart disease Mother   . Hypertension Mother   . Sjogren's syndrome Mother   . Lupus Mother   . GER disease Mother   . Arthritis Mother   . Hypertension Father   . Leukemia Maternal Grandmother   . Epilepsy Maternal Grandfather     Current Outpatient Medications (Endocrine & Metabolic):  .  norgestimate-ethinyl estradiol (SPRINTEC 28) 0.25-35 MG-MCG tablet, Take 1 tablet by mouth daily.      Current Outpatient Medications (Other):  .  Benzoyl Peroxide (BENZEPRO FOAMING CLOTHS) 6 % MISC, Use 1 each BID .  cephALEXin (KEFLEX) 500 MG capsule, Take 2 capsules (1,000 mg total) by mouth 2 (two) times daily. .  Cholecalciferol (VITAMIN  D3) 2000 units capsule, Take 1 capsule (2,000 Units total) by mouth daily.    Past medical history, social, surgical and family history all reviewed in electronic medical record.  No pertanent information unless stated regarding to the chief complaint.   Review of Systems:  No headache, visual changes, nausea, vomiting, diarrhea, constipation, dizziness, abdominal pain, skin rash, fevers, chills, night sweats, weight loss, swollen lymph nodes, body aches, joint swelling, muscle aches, chest pain, shortness of breath, mood changes.   Objective  Blood pressure 120/82, pulse 98, height 5\' 7"  (1.702 m), weight 236 lb (107 kg), SpO2 97 %. Systems examined below as of    General: No apparent distress alert and  oriented x3 mood and affect normal, dressed appropriately.  HEENT: Pupils equal, extraocular movements intact  Respiratory: Patient's speak in full sentences and does not appear short of breath  Cardiovascular: No lower extremity edema, non tender, no erythema  Skin: Warm dry intact with no signs of infection or rash on extremities or on axial skeleton.  Abdomen: Gravid Neuro: Cranial nerves II through XII are intact, neurovascularly intact in all extremities with 2+ DTRs and 2+ pulses.  Lymph: No lymphadenopathy of posterior or anterior cervical chain or axillae bilaterally.  Gait normal with good balance and coordination.  MSK:  Non tender with full range of motion and good stability and symmetric strength and tone of shoulders, elbows, hip, knee and ankles bilaterally.  Right wrist exam shows the patient does have full range of motion.  Good grip strength.  Positive Tinel and Phalen's on the right.  No pain at the elbow.  Full supination and pronation.  Limited musculoskeletal ultrasound was performed and interpreted by Lyndal Pulley  Limited ultrasound of patient's wrist shows the patient does have mild enlargement with hypoechoic changes of the nerve sheath of the median nerve consistent with mild carpal tunnel.  There is no significant enlargement of the area in diameter.  Patient otherwise fairly unremarkable with no bony abnormalities. Impression: Mild carpal tunnel    Impression and Recommendations:     . The above documentation has been reviewed and is accurate and complete Lyndal Pulley, DO       Note: This dictation was prepared with Dragon dictation along with smaller phrase technology. Any transcriptional errors that result from this process are unintentional.

## 2018-11-25 NOTE — Assessment & Plan Note (Signed)
Right carpal tunnel syndrome.  Discussed bracing at night, discussed ergonomics at work, discussed Tylenol for pain relief if necessary.  Hold on any other medications with patient being pregnant.  Worsening pain unfortunately neck step would be injection.  Patient is in agreement with the plan.  Follow-up with me again 2 to 3 months

## 2018-12-16 DIAGNOSIS — Z3400 Encounter for supervision of normal first pregnancy, unspecified trimester: Secondary | ICD-10-CM | POA: Diagnosis not present

## 2018-12-16 DIAGNOSIS — Z3480 Encounter for supervision of other normal pregnancy, unspecified trimester: Secondary | ICD-10-CM | POA: Diagnosis not present

## 2018-12-16 DIAGNOSIS — Z23 Encounter for immunization: Secondary | ICD-10-CM | POA: Diagnosis not present

## 2019-01-20 ENCOUNTER — Encounter: Payer: Self-pay | Admitting: Internal Medicine

## 2019-01-20 ENCOUNTER — Ambulatory Visit (INDEPENDENT_AMBULATORY_CARE_PROVIDER_SITE_OTHER): Payer: 59 | Admitting: Internal Medicine

## 2019-01-20 ENCOUNTER — Other Ambulatory Visit: Payer: Self-pay

## 2019-01-20 VITALS — BP 124/76 | HR 105 | Temp 98.3°F | Ht 67.0 in | Wt 247.0 lb

## 2019-01-20 DIAGNOSIS — Z3A31 31 weeks gestation of pregnancy: Secondary | ICD-10-CM | POA: Diagnosis not present

## 2019-01-20 DIAGNOSIS — Z0000 Encounter for general adult medical examination without abnormal findings: Secondary | ICD-10-CM | POA: Diagnosis not present

## 2019-01-20 DIAGNOSIS — Z3490 Encounter for supervision of normal pregnancy, unspecified, unspecified trimester: Secondary | ICD-10-CM | POA: Insufficient documentation

## 2019-01-20 LAB — CBC WITH DIFFERENTIAL/PLATELET
Basophils Absolute: 0 10*3/uL (ref 0.0–0.1)
Basophils Relative: 0.4 % (ref 0.0–3.0)
Eosinophils Absolute: 0.1 10*3/uL (ref 0.0–0.7)
Eosinophils Relative: 1.7 % (ref 0.0–5.0)
HCT: 36 % (ref 36.0–46.0)
Hemoglobin: 11.8 g/dL — ABNORMAL LOW (ref 12.0–15.0)
Lymphocytes Relative: 26.6 % (ref 12.0–46.0)
Lymphs Abs: 2.3 10*3/uL (ref 0.7–4.0)
MCHC: 32.9 g/dL (ref 30.0–36.0)
MCV: 89.2 fl (ref 78.0–100.0)
Monocytes Absolute: 0.6 10*3/uL (ref 0.1–1.0)
Monocytes Relative: 6.6 % (ref 3.0–12.0)
Neutro Abs: 5.6 10*3/uL (ref 1.4–7.7)
Neutrophils Relative %: 64.7 % (ref 43.0–77.0)
Platelets: 300 10*3/uL (ref 150.0–400.0)
RBC: 4.04 Mil/uL (ref 3.87–5.11)
RDW: 13.9 % (ref 11.5–15.5)
WBC: 8.6 10*3/uL (ref 4.0–10.5)

## 2019-01-20 LAB — HEPATIC FUNCTION PANEL
ALT: 12 U/L (ref 0–35)
AST: 16 U/L (ref 0–37)
Albumin: 3.2 g/dL — ABNORMAL LOW (ref 3.5–5.2)
Alkaline Phosphatase: 84 U/L (ref 39–117)
Bilirubin, Direct: 0.1 mg/dL (ref 0.0–0.3)
Total Bilirubin: 0.2 mg/dL (ref 0.2–1.2)
Total Protein: 6.8 g/dL (ref 6.0–8.3)

## 2019-01-20 LAB — BASIC METABOLIC PANEL
BUN: 7 mg/dL (ref 6–23)
CO2: 23 mEq/L (ref 19–32)
Calcium: 8.5 mg/dL (ref 8.4–10.5)
Chloride: 107 mEq/L (ref 96–112)
Creatinine, Ser: 0.57 mg/dL (ref 0.40–1.20)
GFR: 123.38 mL/min (ref >60.00)
Glucose, Bld: 74 mg/dL (ref 70–99)
Potassium: 3.5 mEq/L (ref 3.5–5.1)
Sodium: 138 mEq/L (ref 135–145)

## 2019-01-20 LAB — LIPID PANEL
Cholesterol: 202 mg/dL — ABNORMAL HIGH (ref 0–200)
HDL: 67.5 mg/dL (ref >39.00)
LDL Cholesterol: 114 mg/dL — ABNORMAL HIGH (ref 0–99)
NonHDL: 134.83
Total CHOL/HDL Ratio: 3
Triglycerides: 105 mg/dL (ref 0.0–149.0)
VLDL: 21 mg/dL (ref 0.0–40.0)

## 2019-01-20 LAB — URINALYSIS
Bilirubin Urine: NEGATIVE
Hgb urine dipstick: NEGATIVE
Ketones, ur: NEGATIVE
Leukocytes,Ua: NEGATIVE
Nitrite: NEGATIVE
Specific Gravity, Urine: 1.015 (ref 1.000–1.030)
Total Protein, Urine: NEGATIVE
Urine Glucose: NEGATIVE
Urobilinogen, UA: 0.2 (ref 0.0–1.0)
pH: 7 (ref 5.0–8.0)

## 2019-01-20 LAB — TSH: TSH: 0.9 u[IU]/mL (ref 0.35–4.50)

## 2019-01-20 NOTE — Assessment & Plan Note (Signed)
We discussed age appropriate health related issues, including available/recomended screening tests and vaccinations. We discussed a need for adhering to healthy diet and exercise. Labs were ordered to be later reviewed . All questions were answered. [redacted] weeks pregnant - Dr Henderson Cloud

## 2019-01-20 NOTE — Progress Notes (Signed)
   Subjective:  Patient ID: Norma White, female    DOB: 05/27/1985  Age: 34 y.o. MRN: [id number]  CC: No chief complaint on file.   HPI Claretta Kendra presents for a well exam [redacted] weeks pregnant  Outpatient Medications Prior to Visit  Medication Sig Dispense Refill  . Prenatal MV-Min-FA-Omega-3 (PRENATAL GUMMIES/DHA & FA) 0.4-32.5 MG CHEW Prenatal Gummies    . Benzoyl Peroxide (BENZEPRO FOAMING CLOTHS) 6 % MISC Use 1 each BID 60 each 11  . cephALEXin (KEFLEX) 500 MG capsule Take 2 capsules (1,000 mg total) by mouth 2 (two) times daily. 16 capsule 1  . Cholecalciferol (VITAMIN D3) 2000 units capsule Take 1 capsule (2,000 Units total) by mouth daily. 100 capsule 3  . norgestimate-ethinyl estradiol (SPRINTEC 28) 0.25-35 MG-MCG tablet Take 1 tablet by mouth daily.     No facility-administered medications prior to visit.    ROS: Review of Systems  Objective:  BP 124/76 (BP Location: Left Arm, Patient Position: Sitting, Cuff Size: Large)   Pulse (!) 105   Temp 98.3 F (36.8 C) (Oral)   Ht 5\' 7"  (1.702 m)   Wt 247 lb (112 kg)   SpO2 96%   BMI 38.69 kg/m   BP Readings from Last 3 Encounters:  01/20/19 124/76  11/25/18 120/82  06/18/18 116/74    Wt Readings from Last 3 Encounters:  01/20/19 247 lb (112 kg)  11/25/18 236 lb (107 kg)  01/16/18 210 lb (95.3 kg)    Physical Exam  Lab Results  Component Value Date   WBC 4.7 01/16/2018   HGB 13.4 01/16/2018   HCT 39.9 01/16/2018   PLT 287.0 01/16/2018   GLUCOSE 100 (H) 01/16/2018   CHOL 172 01/16/2018   TRIG 62.0 01/16/2018   HDL 58.60 01/16/2018   LDLCALC 101 (H) 01/16/2018   ALT 21 01/16/2018   AST 21 01/16/2018   NA 138 01/16/2018   K 3.7 01/16/2018   CL 106 01/16/2018   CREATININE 0.75 01/16/2018   BUN 11 01/16/2018   CO2 22 01/16/2018   TSH 0.81 01/16/2018    DG Thoracic Spine 1 View  Result Date: 10/27/2009 Clinical Data: Back pain.  Patient lifted a dog 2 weeks ago.  Pain between shoulder blades since  then.  THORACIC SPINE - 2 VIEW  Comparison: 12/21/2005  Findings: There is normal alignment of the thoracic spine.  No evidence for acute fracture or subluxation.  Paravertebral line has a normal appearance.  Visualized posterior ribs are unremarkable in appearance.  IMPRESSION: Negative exam. Provider: 12/23/2005   Assessment & Plan:   There are no diagnoses linked to this encounter.   No orders of the defined types were placed in this encounter.    Follow-up: No follow-ups on file.  Sullivan Lone, MD

## 2019-01-20 NOTE — Assessment & Plan Note (Addendum)
Prenatal MVI

## 2019-02-18 DIAGNOSIS — Z3685 Encounter for antenatal screening for Streptococcus B: Secondary | ICD-10-CM | POA: Diagnosis not present

## 2019-02-18 LAB — OB RESULTS CONSOLE GBS: GBS: NEGATIVE

## 2019-03-05 ENCOUNTER — Telehealth (HOSPITAL_COMMUNITY): Payer: Self-pay | Admitting: *Deleted

## 2019-03-05 ENCOUNTER — Encounter (HOSPITAL_COMMUNITY): Payer: Self-pay | Admitting: *Deleted

## 2019-03-05 NOTE — Telephone Encounter (Signed)
Preadmission screen  

## 2019-03-06 ENCOUNTER — Encounter (HOSPITAL_COMMUNITY): Payer: Self-pay | Admitting: *Deleted

## 2019-03-16 ENCOUNTER — Other Ambulatory Visit (HOSPITAL_COMMUNITY)
Admission: RE | Admit: 2019-03-16 | Discharge: 2019-03-16 | Disposition: A | Discharge status: Home / Self Care | Payer: 59 | Source: Ambulatory Visit | Attending: Obstetrics and Gynecology | Admitting: Obstetrics and Gynecology

## 2019-03-16 DIAGNOSIS — Z87891 Personal history of nicotine dependence: Secondary | ICD-10-CM | POA: Diagnosis not present

## 2019-03-16 DIAGNOSIS — Z3A39 39 weeks gestation of pregnancy: Secondary | ICD-10-CM | POA: Diagnosis not present

## 2019-03-16 DIAGNOSIS — Z20822 Contact with and (suspected) exposure to covid-19: Secondary | ICD-10-CM | POA: Diagnosis not present

## 2019-03-16 LAB — SARS CORONAVIRUS 2 (TAT 6-24 HRS): SARS Coronavirus 2: NEGATIVE

## 2019-03-17 NOTE — H&P (Signed)
Norma White is a 34 y.o. female presenting for IOL at term. Pregnancy uncomplicated.  OB History    Gravida  1   Para      Term      Preterm      AB      Living        SAB      TAB      Ectopic      Multiple      Live Births             Past Medical History:  Diagnosis Date  . IBS (irritable bowel syndrome)    Past Surgical History:  Procedure Laterality Date  . TOOTH EXTRACTION     Family History: family history includes Arthritis in her mother; Epilepsy in her maternal grandfather; GER disease in her mother; Heart attack in her mother; Heart disease in her mother; Hypertension in her father and mother; Leukemia in her maternal grandmother; Lupus in her mother; Seizures in her father; Sjogren's syndrome in her mother. Social History:  reports that she quit smoking about a year ago. She has never used smokeless tobacco. She reports current alcohol use. She reports that she does not use drugs.     Maternal Diabetes: No Genetic Screening: Declined Maternal Ultrasounds/Referrals: Normal Fetal Ultrasounds or other Referrals:  None Maternal Substance Abuse:  No Significant Maternal Medications:  None Significant Maternal Lab Results:  Group B Strep negative Other Comments:  None  Review of Systems  Eyes: Negative for visual disturbance.  Gastrointestinal: Negative for abdominal pain.  Neurological: Negative for headaches.   Maternal Medical History:  Fetal activity: Perceived fetal activity is normal.        Last menstrual period 06/14/2018. Maternal Exam:  Abdomen: Fetal presentation: vertex     Physical Exam  Cardiovascular: Normal rate.  Respiratory: Effort normal.  GI: Soft.    Cx 0/30%/-3 in office 03/11/19  Prenatal labs: ABO, Rh: O/Positive/-- (08/10 0000) Antibody: Negative (08/10 0000) Rubella: Immune (08/10 0000) RPR: Nonreactive (08/10 0000)  HBsAg: Negative (08/10 0000)  HIV: Non-reactive (08/10 0000)  GBS: Negative/-- (02/17  0000)   Assessment/Plan: 34 yo G1P0 at term for two stage IOL   Roselle Locus II 03/17/2019, 5:48 PM

## 2019-03-18 ENCOUNTER — Encounter (HOSPITAL_COMMUNITY): Payer: Self-pay | Admitting: Obstetrics and Gynecology

## 2019-03-18 ENCOUNTER — Inpatient Hospital Stay (HOSPITAL_COMMUNITY): Payer: 59

## 2019-03-18 ENCOUNTER — Other Ambulatory Visit: Payer: Self-pay

## 2019-03-18 ENCOUNTER — Inpatient Hospital Stay (HOSPITAL_COMMUNITY)
Admission: AD | Admit: 2019-03-18 | Discharge: 2019-03-20 | DRG: 807 | Disposition: A | Discharge status: Home / Self Care | Payer: 59 | Attending: Obstetrics and Gynecology | Admitting: Obstetrics and Gynecology

## 2019-03-18 ENCOUNTER — Inpatient Hospital Stay (HOSPITAL_COMMUNITY): Payer: 59 | Admitting: Anesthesiology

## 2019-03-18 DIAGNOSIS — Z20822 Contact with and (suspected) exposure to covid-19: Secondary | ICD-10-CM | POA: Diagnosis present

## 2019-03-18 DIAGNOSIS — Z3A39 39 weeks gestation of pregnancy: Secondary | ICD-10-CM

## 2019-03-18 DIAGNOSIS — O26893 Other specified pregnancy related conditions, third trimester: Secondary | ICD-10-CM | POA: Diagnosis present

## 2019-03-18 DIAGNOSIS — Z3A00 Weeks of gestation of pregnancy not specified: Secondary | ICD-10-CM | POA: Diagnosis not present

## 2019-03-18 DIAGNOSIS — Z3490 Encounter for supervision of normal pregnancy, unspecified, unspecified trimester: Secondary | ICD-10-CM

## 2019-03-18 DIAGNOSIS — Z87891 Personal history of nicotine dependence: Secondary | ICD-10-CM | POA: Diagnosis not present

## 2019-03-18 DIAGNOSIS — O43819 Placental infarction, unspecified trimester: Secondary | ICD-10-CM | POA: Diagnosis not present

## 2019-03-18 LAB — CBC
HCT: 37.4 % (ref 36.0–46.0)
Hemoglobin: 11.9 g/dL — ABNORMAL LOW (ref 12.0–15.0)
MCH: 29.2 pg (ref 26.0–34.0)
MCHC: 31.8 g/dL (ref 30.0–36.0)
MCV: 91.7 fL (ref 80.0–100.0)
Platelets: 312 10*3/uL (ref 150–400)
RBC: 4.08 MIL/uL (ref 3.87–5.11)
RDW: 14.2 % (ref 11.5–15.5)
WBC: 9.8 10*3/uL (ref 4.0–10.5)
nRBC: 0 % (ref 0.0–0.2)

## 2019-03-18 LAB — TYPE AND SCREEN
ABO/RH(D): O POS
Antibody Screen: NEGATIVE

## 2019-03-18 LAB — RPR: RPR Ser Ql: NONREACTIVE

## 2019-03-18 LAB — ABO/RH: ABO/RH(D): O POS

## 2019-03-18 MED ORDER — PHENYLEPHRINE 40 MCG/ML (10ML) SYRINGE FOR IV PUSH (FOR BLOOD PRESSURE SUPPORT): 80.0000 ug | PREFILLED_SYRINGE | INTRAVENOUS | Status: DC | PRN

## 2019-03-18 MED ORDER — ACETAMINOPHEN 325 MG PO TABS: 650.0000 mg | ORAL_TABLET | ORAL | Status: DC | PRN

## 2019-03-18 MED ORDER — TERBUTALINE SULFATE 1 MG/ML IJ SOLN: 0.2500 mg | Freq: Once | INTRAMUSCULAR | Status: DC | PRN

## 2019-03-18 MED ORDER — SOD CITRATE-CITRIC ACID 500-334 MG/5ML PO SOLN: 30.0000 mL | ORAL | Status: DC | PRN

## 2019-03-18 MED ORDER — ONDANSETRON HCL 4 MG/2ML IJ SOLN: 4.0000 mg | Freq: Four times a day (QID) | INTRAMUSCULAR | Status: DC | PRN

## 2019-03-18 MED ORDER — OXYTOCIN 40 UNITS IN NORMAL SALINE INFUSION - SIMPLE MED
1.0000 m[IU]/min | INTRAVENOUS | Status: DC
  Administered 2019-03-18: 2 m[IU]/min via INTRAVENOUS

## 2019-03-18 MED ORDER — FENTANYL CITRATE (PF) 100 MCG/2ML IJ SOLN: 50.0000 ug | INTRAMUSCULAR | Status: DC | PRN

## 2019-03-18 MED ORDER — DIPHENHYDRAMINE HCL 50 MG/ML IJ SOLN: 12.5000 mg | INTRAMUSCULAR | Status: DC | PRN

## 2019-03-18 MED ORDER — FENTANYL-BUPIVACAINE-NACL 0.5-0.125-0.9 MG/250ML-% EP SOLN: 12.0000 mL/h | EPIDURAL | Status: DC | PRN

## 2019-03-18 MED ORDER — SODIUM CHLORIDE (PF) 0.9 % IJ SOLN
INTRAMUSCULAR | Status: DC | PRN
  Administered 2019-03-18: 12 mL/h via EPIDURAL

## 2019-03-18 MED ORDER — EPHEDRINE 5 MG/ML INJ: 10.0000 mg | INTRAVENOUS | Status: DC | PRN

## 2019-03-18 MED ORDER — OXYCODONE-ACETAMINOPHEN 5-325 MG PO TABS: 1.0000 | ORAL_TABLET | ORAL | Status: DC | PRN

## 2019-03-18 MED ORDER — OXYCODONE-ACETAMINOPHEN 5-325 MG PO TABS: 2.0000 | ORAL_TABLET | ORAL | Status: DC | PRN

## 2019-03-18 MED ORDER — LIDOCAINE HCL (PF) 1 % IJ SOLN: 30.0000 mL | INTRAMUSCULAR | Status: DC | PRN

## 2019-03-18 MED ORDER — LACTATED RINGERS IV SOLN: 500.0000 mL | Freq: Once | INTRAVENOUS | Status: DC

## 2019-03-18 MED ORDER — MISOPROSTOL 25 MCG QUARTER TABLET
25.0000 ug | ORAL_TABLET | ORAL | Status: DC | PRN
  Administered 2019-03-18 (×3): 25 ug via VAGINAL
  Filled 2019-03-18 (×3): qty 1

## 2019-03-18 MED ORDER — LACTATED RINGERS IV SOLN: INTRAVENOUS | Status: DC

## 2019-03-18 MED ORDER — LIDOCAINE HCL (PF) 1 % IJ SOLN
INTRAMUSCULAR | Status: DC | PRN
  Administered 2019-03-18 (×2): 4 mL via EPIDURAL

## 2019-03-18 MED ORDER — OXYTOCIN 40 UNITS IN NORMAL SALINE INFUSION - SIMPLE MED
2.5000 [IU]/h | INTRAVENOUS | Status: DC
  Filled 2019-03-18: qty 1000

## 2019-03-18 MED ORDER — FLEET ENEMA 7-19 GM/118ML RE ENEM: 1.0000 | ENEMA | RECTAL | Status: DC | PRN

## 2019-03-18 MED ORDER — OXYTOCIN BOLUS FROM INFUSION
500.0000 mL | Freq: Once | INTRAVENOUS | Status: AC
  Administered 2019-03-19: 500 mL via INTRAVENOUS

## 2019-03-18 MED ORDER — LACTATED RINGERS IV SOLN: 500.0000 mL | INTRAVENOUS | Status: DC | PRN

## 2019-03-18 NOTE — Anesthesia Preprocedure Evaluation (Signed)
Anesthesia Evaluation  Patient identified by MRN, date of birth, ID band Patient awake    Reviewed: Allergy & Precautions, Patient's Chart, lab work & pertinent test results  History of Anesthesia Complications Negative for: history of anesthetic complications  Airway Mallampati: II  TM Distance: >3 FB Neck ROM: Full    Dental no notable dental hx.    Pulmonary former smoker,    Pulmonary exam normal        Cardiovascular negative cardio ROS Normal cardiovascular exam     Neuro/Psych negative neurological ROS  negative psych ROS   GI/Hepatic negative GI ROS, Neg liver ROS,   Endo/Other  negative endocrine ROS  Renal/GU negative Renal ROS  negative genitourinary   Musculoskeletal negative musculoskeletal ROS (+)   Abdominal   Peds  Hematology negative hematology ROS (+)   Anesthesia Other Findings Day of surgery medications reviewed with patient.  Reproductive/Obstetrics (+) Pregnancy                             Anesthesia Physical Anesthesia Plan  ASA: II  Anesthesia Plan: Epidural   Post-op Pain Management:    Induction:   PONV Risk Score and Plan: Treatment may vary due to age or medical condition  Airway Management Planned: Natural Airway  Additional Equipment:   Intra-op Plan:   Post-operative Plan:   Informed Consent: I have reviewed the patients History and Physical, chart, labs and discussed the procedure including the risks, benefits and alternatives for the proposed anesthesia with the patient or authorized representative who has indicated his/her understanding and acceptance.       Plan Discussed with:   Anesthesia Plan Comments:         Anesthesia Quick Evaluation  

## 2019-03-18 NOTE — Progress Notes (Signed)
FHT cat one UCs difficult to trace Cx 3/C/-2 AROM clear IUPC placed

## 2019-03-18 NOTE — Anesthesia Procedure Notes (Signed)
Epidural Patient location during procedure: OB Start time: 03/18/2019 6:34 PM End time: 03/18/2019 6:37 PM  Staffing Anesthesiologist: Kaylyn Layer, MD Performed: anesthesiologist   Preanesthetic Checklist Completed: patient identified, IV checked, risks and benefits discussed, monitors and equipment checked, pre-op evaluation and timeout performed  Epidural Patient position: sitting Prep: DuraPrep and site prepped and draped Patient monitoring: continuous pulse ox, blood pressure and heart rate Approach: midline Location: L3-L4 Injection technique: LOR air  Needle:  Needle type: Tuohy  Needle gauge: 17 G Needle length: 9 cm Needle insertion depth: 9 cm Catheter type: closed end flexible Catheter size: 19 Gauge Catheter at skin depth: 15 cm Test dose: negative and Other (1% lidocaine)  Assessment Events: blood not aspirated, injection not painful, no injection resistance, no paresthesia and negative IV test  Additional Notes Patient identified. Risks, benefits, and alternatives discussed with patient including but not limited to bleeding, infection, nerve damage, paralysis, failed block, incomplete pain control, headache, blood pressure changes, nausea, vomiting, reactions to medication, itching, and postpartum back pain. Confirmed with bedside nurse the patient's most recent platelet count. Confirmed with patient that they are not currently taking any anticoagulation, have any bleeding history, or any family history of bleeding disorders. Patient expressed understanding and wished to proceed. All questions were answered. Sterile technique was used throughout the entire procedure. Please see nursing notes for vital signs.   Crisp LOR after one needle redirection. Test dose was given through epidural catheter and negative prior to continuing to dose epidural or start infusion. Warning signs of high block given to the patient including shortness of breath, tingling/numbness in  hands, complete motor block, or any concerning symptoms with instructions to call for help. Patient was given instructions on fall risk and not to get out of bed. All questions and concerns addressed with instructions to call with any issues or inadequate analgesia.  Reason for block:procedure for pain

## 2019-03-18 NOTE — Progress Notes (Signed)
FHT cat one UCs irregular, mild Cx Closed/60/-3/vtx/soft/posterior Cytotec #3 placed Pitocin in 4 hours D/W patient

## 2019-03-19 ENCOUNTER — Encounter (HOSPITAL_COMMUNITY): Payer: Self-pay | Admitting: Obstetrics and Gynecology

## 2019-03-19 LAB — CBC
HCT: 31.8 % — ABNORMAL LOW (ref 36.0–46.0)
Hemoglobin: 10.6 g/dL — ABNORMAL LOW (ref 12.0–15.0)
MCH: 29.8 pg (ref 26.0–34.0)
MCHC: 33.3 g/dL (ref 30.0–36.0)
MCV: 89.3 fL (ref 80.0–100.0)
Platelets: 253 10*3/uL (ref 150–400)
RBC: 3.56 MIL/uL — ABNORMAL LOW (ref 3.87–5.11)
RDW: 14.1 % (ref 11.5–15.5)
WBC: 15 10*3/uL — ABNORMAL HIGH (ref 4.0–10.5)
nRBC: 0 % (ref 0.0–0.2)

## 2019-03-19 MED ORDER — BENZOCAINE-MENTHOL 20-0.5 % EX AERO: 1.0000 "application " | INHALATION_SPRAY | CUTANEOUS | Status: DC | PRN

## 2019-03-19 MED ORDER — DIBUCAINE (PERIANAL) 1 % EX OINT: 1.0000 "application " | TOPICAL_OINTMENT | CUTANEOUS | Status: DC | PRN

## 2019-03-19 MED ORDER — ONDANSETRON HCL 4 MG/2ML IJ SOLN: 4.0000 mg | INTRAMUSCULAR | Status: DC | PRN

## 2019-03-19 MED ORDER — ONDANSETRON HCL 4 MG PO TABS: 4.0000 mg | ORAL_TABLET | ORAL | Status: DC | PRN

## 2019-03-19 MED ORDER — OXYCODONE HCL 5 MG PO TABS: 5.0000 mg | ORAL_TABLET | ORAL | Status: DC | PRN

## 2019-03-19 MED ORDER — SIMETHICONE 80 MG PO CHEW: 80.0000 mg | CHEWABLE_TABLET | ORAL | Status: DC | PRN

## 2019-03-19 MED ORDER — ACETAMINOPHEN 325 MG PO TABS: 650.0000 mg | ORAL_TABLET | ORAL | Status: DC | PRN

## 2019-03-19 MED ORDER — WITCH HAZEL-GLYCERIN EX PADS: 1.0000 "application " | MEDICATED_PAD | CUTANEOUS | Status: DC | PRN

## 2019-03-19 MED ORDER — PRENATAL MULTIVITAMIN CH
1.0000 | ORAL_TABLET | Freq: Every day | ORAL | Status: DC
  Administered 2019-03-19 – 2019-03-20 (×2): 1 via ORAL
  Filled 2019-03-19 (×2): qty 1

## 2019-03-19 MED ORDER — IBUPROFEN 600 MG PO TABS
600.0000 mg | ORAL_TABLET | Freq: Four times a day (QID) | ORAL | Status: DC
  Administered 2019-03-19 – 2019-03-20 (×5): 600 mg via ORAL
  Filled 2019-03-19 (×5): qty 1

## 2019-03-19 MED ORDER — DIPHENHYDRAMINE HCL 25 MG PO CAPS: 25.0000 mg | ORAL_CAPSULE | Freq: Four times a day (QID) | ORAL | Status: DC | PRN

## 2019-03-19 MED ORDER — COCONUT OIL OIL: 1.0000 "application " | TOPICAL_OIL | Status: DC | PRN

## 2019-03-19 MED ORDER — SENNOSIDES-DOCUSATE SODIUM 8.6-50 MG PO TABS
2.0000 | ORAL_TABLET | ORAL | Status: DC
  Administered 2019-03-19: 2 via ORAL
  Filled 2019-03-19: qty 2

## 2019-03-19 MED ORDER — TETANUS-DIPHTH-ACELL PERTUSSIS 5-2.5-18.5 LF-MCG/0.5 IM SUSP: 0.5000 mL | Freq: Once | INTRAMUSCULAR | Status: DC

## 2019-03-19 MED ORDER — ZOLPIDEM TARTRATE 5 MG PO TABS: 5.0000 mg | ORAL_TABLET | Freq: Every evening | ORAL | Status: DC | PRN

## 2019-03-19 MED ORDER — OXYCODONE HCL 5 MG PO TABS: 10.0000 mg | ORAL_TABLET | ORAL | Status: DC | PRN

## 2019-03-19 NOTE — Lactation Note (Signed)
This note was copied from a baby's chart. Lactation Consultation Note  Patient Name: Girl Alaiyah Bollman [id number] Date: 03/19/2019 Reason for consult: Follow-up assessment;Difficult latch Baby is 10 hours old.  Mom reports that baby is having a difficult time sustaining a latch.  She has tried a nipple shield with some improvement.  Mom has initiated pumping and also supplementing with donor milk.  Stressed importance of pumping.  Instructed to call for assist when baby is ready to feed.  Maternal Data    Feeding Feeding Type: Donor Breast Milk Nipple Type: Slow - flow  LATCH Score                   Interventions    Lactation Tools Discussed/Used Pump Review: Setup, frequency, and cleaning;Milk Storage Initiated by:: [facility]J Date initiated:: 03/19/19   Consult Status Consult Status: Follow-up Date: 03/20/19 Follow-up type: In-patient    Huston Foley 03/19/2019, 12:04 PM

## 2019-03-19 NOTE — Progress Notes (Signed)
Operative Delivery Note At 1:19 AM a viable female was delivered via Vaginal, Vacuum Investment banker, operational).  Presentation: vertex; Position: Left,, Occiput,, Anterior; Station: +2.  Verbal consent: obtained from patient.  Risks and benefits discussed in detail.  Risks include, but are not limited to the risks of anesthesia, bleeding, infection, damage to maternal tissues, fetal cephalhematoma.  There is also the risk of inability to effect vaginal delivery of the head, or shoulder dystocia that cannot be resolved by established maneuvers, leading to the need for emergency cesarean section. Pushing with good effort. FHT with repetitive late decelerations into 60s. VE D/W to shorten second stage. Good progress over about 4 UCs. Delivery of vertex and shoulder dystocia noted. McRobert's maneuver, supine, suprapubic pressure and normal traction>delivery of anterior shoulder. Total time 30 seconds. Peds present. Baby vigorous with good cry and tone at delivery. APGAR: , ; weight  .   Placenta status:intact ,to path .   Cord: 3 vessels with the following complications:none .  Cord pH: pending  Anesthesia:  epidural Instruments: Kiwi VE Episiotomy: second degree MLE Lacerations:  Suture Repair: 2.0 vicryl rapide Est. Blood Loss (mL):  300  Mom to postpartum.  Baby to Couplet care / Skin to Skin.  Roselle Locus II 03/19/2019, 1:37 AM

## 2019-03-19 NOTE — Anesthesia Postprocedure Evaluation (Signed)
Anesthesia Post Note  Patient: Norma White  Procedure(s) Performed: AN AD HOC LABOR EPIDURAL     Patient location during evaluation: Mother Baby Anesthesia Type: Epidural Level of consciousness: awake and alert Pain management: pain level controlled Vital Signs Assessment: post-procedure vital signs reviewed and stable Respiratory status: spontaneous breathing, nonlabored ventilation and respiratory function stable Cardiovascular status: stable Postop Assessment: no headache, no backache and epidural receding Anesthetic complications: no    Last Vitals:  Vitals:   03/19/19 0325 03/19/19 0435  BP: 126/86 126/90  Pulse: 70 84  Resp: 16 16  Temp: 36.5 C 36.7 C  SpO2: 98%     Last Pain:  Vitals:   03/19/19 0528  TempSrc:   PainSc: 0-No pain   Pain Goal:                   Junious Silk

## 2019-03-19 NOTE — Progress Notes (Signed)
Post Partum Day 0 Subjective: no complaints, up ad lib, voiding and tolerating PO  Objective: Blood pressure 120/86, pulse 70, temperature 97.9 F (36.6 C), temperature source Oral, resp. rate 18, height 5' 7.5" (1.715 m), weight 115.9 kg, last menstrual period 06/14/2018, SpO2 98 %, unknown if currently breastfeeding.  Physical Exam:  General: alert, cooperative and appears stated age Lochia: appropriate Uterine Fundus: firm Incision: healing well, no significant drainage, no dehiscence DVT Evaluation: No evidence of DVT seen on physical exam.  Recent Labs    03/18/19 0030 03/19/19 0548  HGB 11.9* 10.6*  HCT 37.4 31.8*    Assessment/Plan: Plan for discharge tomorrow and Breastfeeding   LOS: 1 day   Mitchel Honour 03/19/2019, 8:43 AM

## 2019-03-19 NOTE — Lactation Note (Signed)
This note was copied from a baby's chart. Lactation Consultation Note  Patient Name: Norma White UXNAT'F Date: 03/19/2019 Reason for consult: Initial assessment;1st time breastfeeding;Term P1, 3 hour term female infant. Tools given: hand pump and shells mom has flat nipples. Per mom, she has a DEBP at home. LC entered the room, infant was cuing to breastfeed. LC had mom pre-pump breast with hand pump prior to latching infant on left breast using the football hold. LC ask mom to wait until infant open's mouth wide, infant latched with chin and nose touching, infant did not sustain latch was on and off breast during feeding infant breastfed for 10 minutes. Mom will continue to work on latching infant at breast and will ask RN or LC for assistance if needed. Mom knows to breastfed infant according to hunger cues, 8 to 12 times within 24 hours and knows not to exceed 3 hours without breastfeeding infant. Mom will wear breast shells in bra during the day and not sleep in them at night. Mom shown how to use hand pump & how to disassemble, clean, & reassemble parts. Reviewed Baby & Me book's Breastfeeding Basics. Mom made aware of O/P services, breastfeeding support groups, community resources, and our phone # for post-discharge questions.    Maternal Data Formula Feeding for Exclusion: No Has patient been taught Hand Expression?: Yes Does the patient have breastfeeding experience prior to this delivery?: No  Feeding Feeding Type: Breast Fed  LATCH Score Latch: Repeated attempts needed to sustain latch, nipple held in mouth throughout feeding, stimulation needed to elicit sucking reflex.  Audible Swallowing: A few with stimulation  Type of Nipple: Flat  Comfort (Breast/Nipple): Soft / non-tender  Hold (Positioning): Assistance needed to correctly position infant at breast and maintain latch.  LATCH Score: 6  Interventions Interventions: Breast feeding basics reviewed;Breast  compression;Assisted with latch;Adjust position;Hand pump;Support pillows;Skin to skin;Breast massage;Position options;Hand express;Expressed milk;Pre-pump if needed;Shells  Lactation Tools Discussed/Used Tools: Shells;Pump Shell Type: Other (comment)(flat) Breast pump type: Manual WIC Program: No Pump Review: Setup, frequency, and cleaning;Milk Storage Initiated by:: RN Date initiated:: 03/19/19   Consult Status Consult Status: Follow-up Date: 03/19/19 Follow-up type: In-patient    Danelle Earthly 03/19/2019, 4:42 AM

## 2019-03-20 LAB — COMPREHENSIVE METABOLIC PANEL
ALT: 22 U/L (ref 0–44)
AST: 41 U/L (ref 15–41)
Albumin: 2.5 g/dL — ABNORMAL LOW (ref 3.5–5.0)
Alkaline Phosphatase: 103 U/L (ref 38–126)
Anion gap: 10 (ref 5–15)
BUN: 5 mg/dL — ABNORMAL LOW (ref 6–20)
CO2: 21 mmol/L — ABNORMAL LOW (ref 22–32)
Calcium: 8.5 mg/dL — ABNORMAL LOW (ref 8.9–10.3)
Chloride: 109 mmol/L (ref 98–111)
Creatinine, Ser: 0.66 mg/dL (ref 0.44–1.00)
GFR calc Af Amer: >60 mL/min (ref >60)
GFR calc non Af Amer: >60 mL/min (ref >60)
Glucose, Bld: 81 mg/dL (ref 70–99)
Potassium: 3.5 mmol/L (ref 3.5–5.1)
Sodium: 140 mmol/L (ref 135–145)
Total Bilirubin: 0.5 mg/dL (ref 0.3–1.2)
Total Protein: 5.9 g/dL — ABNORMAL LOW (ref 6.5–8.1)

## 2019-03-20 LAB — CBC
HCT: 30.8 % — ABNORMAL LOW (ref 36.0–46.0)
Hemoglobin: 10 g/dL — ABNORMAL LOW (ref 12.0–15.0)
MCH: 29 pg (ref 26.0–34.0)
MCHC: 32.5 g/dL (ref 30.0–36.0)
MCV: 89.3 fL (ref 80.0–100.0)
Platelets: 264 10*3/uL (ref 150–400)
RBC: 3.45 MIL/uL — ABNORMAL LOW (ref 3.87–5.11)
RDW: 14.3 % (ref 11.5–15.5)
WBC: 10.6 10*3/uL — ABNORMAL HIGH (ref 4.0–10.5)
nRBC: 0 % (ref 0.0–0.2)

## 2019-03-20 NOTE — Lactation Note (Signed)
This note was copied from a baby's chart. Lactation Consultation Note  Patient Name: Norma White IZTIW'P Date: 03/20/2019 Reason for consult: Follow-up assessment;Mother's request Norma White Leisure is 66 hours old.  Mom was able to latch baby using nipple shield with LC assist last night.  She is pumping and supplementing with donor milk.  Mom states she has decided to pump and bottle feed.  She has a breast pump at home.  Mom plans on giving formula after discharge until milk comes to volume.  Discussed milk coming to volume and the prevention and treatment of engorgement.  No questions or concerns.  Reviewed outpatient services and encouraged to call prn.  Maternal Data    Feeding Feeding Type: Donor Breast Milk Nipple Type: Slow - flow  LATCH Score                   Interventions    Lactation Tools Discussed/Used     Consult Status Consult Status: Complete Follow-up type: Call as needed    Huston Foley 03/20/2019, 9:39 AM

## 2019-03-20 NOTE — Lactation Note (Signed)
This note was copied from a baby's chart. Lactation Consultation Note  Patient Name: Norma White JOINO'M Date: 03/20/2019 Reason for consult: Follow-up assessment;Term;Infant weight loss;Difficult latch;Mother's request P1, 23 hour ETI female infant being supplemented with donor breastmilk. Mom hand express and colostrum was present in breast. Per mom, she has been using DEBP as advised every 3 hours. Per dad, mom has not been latching infant, she mostly been using DEBP and giving donor breast milk. LC discussed importance of latching infant at breast to help establish milk supply and for long term breastfeeding.  Mom has flat nipples, LC ask mom to do breast stimulation help evert nipples shaft out more and fitted mom with 20 mm NS. LC pre-filled NS with 0.5 ml of donor breast milk and infant sustained latch, infant was supplemented at breast with total 12 mls of donor breast milk with curve tip syringe. Mom was pleased due infant sustaining latch and breastfeeding for 20 minutes. Mom will continue to work towards latching infant at breast. Mom knows to continue to use DEBP every 3 hours to help with breast stimulation and establish milk supply. Mom knows to call RN or LC if she needs assistance with latching infant at breast.   Maternal Data    Feeding Feeding Type: Donor Breast Milk Nipple Type: Slow - flow  LATCH Score Latch: Grasps breast easily, tongue down, lips flanged, rhythmical sucking.  Audible Swallowing: Spontaneous and intermittent  Type of Nipple: Flat  Comfort (Breast/Nipple): Soft / non-tender  Hold (Positioning): Assistance needed to correctly position infant at breast and maintain latch.  LATCH Score: 8  Interventions Interventions: Assisted with latch;Adjust position;Support pillows;Skin to skin;Breast massage;Position options;Expressed milk;Breast compression  Lactation Tools Discussed/Used     Consult Status Consult Status: Follow-up Date:  03/20/19 Follow-up type: In-patient    Danelle Earthly 03/20/2019, 12:34 AM

## 2019-03-20 NOTE — Discharge Summary (Signed)
Obstetric Discharge Summary Reason for Admission: induction of labor Prenatal Procedures: none Intrapartum Procedures: vacuum nrFHT Postpartum Procedures: none Complications-Operative and Postpartum: 2nd degree perineal laceration and isolated BP elevation with normal labs  Has fu in 3 days in office for BP check.  Hemoglobin  Date Value Ref Range Status  03/20/2019 10.0 (L) 12.0 - 15.0 g/dL Final   HCT  Date Value Ref Range Status  03/20/2019 30.8 (L) 36.0 - 46.0 % Final    Physical Exam:  General: alert Lochia: appropriate Uterine Fundus: firm Incision: healing well DVT Evaluation: No evidence of DVT seen on physical exam.  Discharge Diagnoses: Term Pregnancy-delivered  Discharge Information: Date: 03/20/2019 Activity: pelvic rest Diet: routine Medications: PNV Condition: stable Instructions: refer to practice specific booklet Discharge to: home   Newborn Data: Live born female  Birth Weight: 7 lb 6.7 oz (3365 g) APGAR: 9, 9  Newborn Delivery   Time head delivered: 03/19/2019 01:18:00 Birth date/time: 03/19/2019 01:19:00 Delivery type: Vaginal, Vacuum (Extractor)      Home with mother.  Ranae Pila 03/20/2019, 11:29 AM

## 2019-03-23 LAB — SURGICAL PATHOLOGY

## 2019-04-10 DIAGNOSIS — L700 Acne vulgaris: Secondary | ICD-10-CM | POA: Diagnosis not present

## 2019-04-10 DIAGNOSIS — L309 Dermatitis, unspecified: Secondary | ICD-10-CM | POA: Diagnosis not present

## 2019-04-27 DIAGNOSIS — Z1389 Encounter for screening for other disorder: Secondary | ICD-10-CM | POA: Diagnosis not present

## 2019-04-27 DIAGNOSIS — Z30430 Encounter for insertion of intrauterine contraceptive device: Secondary | ICD-10-CM | POA: Diagnosis not present

## 2019-04-27 DIAGNOSIS — Z3202 Encounter for pregnancy test, result negative: Secondary | ICD-10-CM | POA: Diagnosis not present

## 2019-06-16 ENCOUNTER — Encounter: Payer: Self-pay | Admitting: Family Medicine

## 2019-06-16 ENCOUNTER — Other Ambulatory Visit: Payer: Self-pay

## 2019-06-16 ENCOUNTER — Ambulatory Visit (INDEPENDENT_AMBULATORY_CARE_PROVIDER_SITE_OTHER): Payer: 59 | Admitting: Family Medicine

## 2019-06-16 DIAGNOSIS — M999 Biomechanical lesion, unspecified: Secondary | ICD-10-CM | POA: Diagnosis not present

## 2019-06-16 DIAGNOSIS — M940 Chondrocostal junction syndrome [Tietze]: Secondary | ICD-10-CM | POA: Diagnosis not present

## 2019-06-16 NOTE — Assessment & Plan Note (Signed)

## 2019-06-16 NOTE — Assessment & Plan Note (Signed)
Sigrid syndrome.  Patient does have a small child at home and I think secondary to holding the child likely is contributing to some of the discomfort and pain.  Discussed icing regimen and home exercises core strengthening and stability.  Patient wants to avoid a lot of the new prescription medications were given a trial of a higher dose of anti-inflammatory.  Responded well to manipulation.  Follow-up again in 4 to 8 weeks

## 2019-06-16 NOTE — Patient Instructions (Signed)
See me again in 2 months Duexis 3x a day for next 3 days Exercises 3x a week

## 2019-06-16 NOTE — Progress Notes (Signed)
[facility] [address] [place] Phone: [phone] Subjective:   I Norma White am serving as a Education administrator for Dr. Hulan Saas.  This visit occurred during the SARS-CoV-2 public health emergency.  Safety protocols were in place, including screening questions prior to the visit, additional usage of staff PPE, and extensive cleaning of exam room while observing appropriate contact time as indicated for disinfecting solutions.   I'm seeing this patient by the request  of:  Plotnikov, Norma Lacks, MD  CC: Back pain  JOA:[id number]  Norma White is a 34 y.o. female coming in with complaint of back pain. Near her epidural injection site she has sharp pain. States she feels like she is getting stabbed in the back.  Patient states that I can catch her breath.  Sometimes hurts her even with laying down.  Denies any association with food.  Patient denies any radiation of pain.  Points to the inferior aspect of the left scapula being the main area.  No fevers chills or any abnormal weight loss     Past Medical History:  Diagnosis Date  . IBS (irritable bowel syndrome)    Past Surgical History:  Procedure Laterality Date  . TOOTH EXTRACTION     Social History   Socioeconomic History  . Marital status: Divorced    Spouse name: Not on file  . Number of children: Not on file  . Years of education: Not on file  . Highest education level: Not on file  Occupational History  . Not on file  Tobacco Use  . Smoking status: Former Smoker    Quit date: 03/06/2018    Years since quitting: 1.2  . Smokeless tobacco: Never Used  Vaping Use  . Vaping Use: Never used  Substance and Sexual Activity  . Alcohol use: Yes  . Drug use: No  . Sexual activity: Yes    Partners: Male    Birth control/protection: Pill  Other Topics Concern  . Not on file  Social History Narrative  . Not on file   Social Determinants of Health   Financial Resource  Strain:   . Difficulty of Paying Living Expenses:   Food Insecurity:   . Worried About Charity fundraiser in the Last Year:   . Arboriculturist in the Last Year:   Transportation Needs:   . Film/video editor (Medical):   Marland Kitchen Lack of Transportation (Non-Medical):   Physical Activity:   . Days of Exercise per Week:   . Minutes of Exercise per Session:   Stress:   . Feeling of Stress :   Social Connections:   . Frequency of Communication with Friends and Family:   . Frequency of Social Gatherings with Friends and Family:   . Attends Religious Services:   . Active Member of Clubs or Organizations:   . Attends Archivist Meetings:   Marland Kitchen Marital Status:    No Known Allergies Family History  Problem Relation Age of Onset  . Heart attack Mother   . Heart disease Mother   . Hypertension Mother   . Sjogren's syndrome Mother   . Lupus Mother   . GER disease Mother   . Arthritis Mother   . Hypertension Father   . Seizures Father   . Leukemia Maternal Grandmother   . Epilepsy Maternal Grandfather          Current Outpatient Medications (Other):  Marland Kitchen  Prenatal Vit-Fe Fumarate-FA (PRENATAL MULTIVITAMIN)  TABS tablet, Take 1 tablet by mouth daily at 12 noon.   Reviewed prior external information including notes and imaging from  primary care provider As well as notes that were available from care everywhere and other healthcare systems.  Past medical history, social, surgical and family history all reviewed in electronic medical record.  No pertanent information unless stated regarding to the chief complaint.   Review of Systems:  No headache, visual changes, nausea, vomiting, diarrhea, constipation, dizziness, abdominal pain, skin rash, fevers, chills, night sweats, weight loss, swollen lymph nodes, body aches, joint swelling, chest pain, shortness of breath, mood changes. POSITIVE muscle aches  Objective  Blood pressure 112/80, pulse 89, height 5' 7.5" (1.715 m),  weight 215 lb (97.5 kg), SpO2 98 %, unknown if currently breastfeeding.   General: No apparent distress alert and oriented x3 mood and affect normal, dressed appropriately.  HEENT: Pupils equal, extraocular movements intact  Respiratory: Patient's speak in full sentences and does not appear short of breath  Cardiovascular: No lower extremity edema, non tender, no erythema  Neuro: Cranial nerves II through XII are intact, neurovascularly intact in all extremities with 2+ DTRs and 2+ pulses.  Gait normal with good balance and coordination.  MSK:   Patient does have some very mild scapular dyskinesis noted on the left side.  Patient is tender to palpation over the seventh rib on the left side.  Patient does have very mild weakness of the parascapular musculature on the left.  Negative Spurling's of the neck.  Tightness of the hip flexors bilaterally  Osteopathic findings  C3 flexed rotated and side bent right T7 extended rotated and side bent right inhaled rib T7 extended rotated and side bent left L2 flexed rotated and side bent right Sacrum right on right     Impression and Recommendations:     The above documentation has been reviewed and is accurate and complete Norma Saa, DO       Note: This dictation was prepared with Dragon dictation along with smaller phrase technology. Any transcriptional errors that result from this process are unintentional.

## 2019-06-23 ENCOUNTER — Other Ambulatory Visit: Payer: Self-pay | Admitting: Internal Medicine

## 2019-06-23 MED ORDER — PHENTERMINE HCL 37.5 MG PO TABS: 37.5000 mg | ORAL_TABLET | Freq: Every day | ORAL | 2 refills | Status: DC

## 2019-06-23 MED FILL — PHENTERMINE 37.5 MG TABLET: 37.5 | 30 days supply | Qty: 30 | Fill #0

## 2019-08-25 ENCOUNTER — Ambulatory Visit: Payer: 59 | Admitting: Family Medicine

## 2019-11-13 ENCOUNTER — Other Ambulatory Visit: Payer: Self-pay

## 2019-11-13 ENCOUNTER — Ambulatory Visit (INDEPENDENT_AMBULATORY_CARE_PROVIDER_SITE_OTHER): Payer: 59

## 2019-11-13 DIAGNOSIS — J040 Acute laryngitis: Secondary | ICD-10-CM | POA: Diagnosis not present

## 2019-11-13 MED ORDER — METHYLPREDNISOLONE ACETATE 80 MG/ML IJ SUSP
80.0000 mg | Freq: Once | INTRAMUSCULAR | Status: AC
  Administered 2019-11-13: 80 mg via INTRAMUSCULAR

## 2019-11-13 NOTE — Progress Notes (Signed)
Depo 80 mg given to pt w/o any complications.

## 2019-11-17 ENCOUNTER — Other Ambulatory Visit: Payer: Self-pay | Admitting: Internal Medicine

## 2019-11-17 MED ORDER — CEFDINIR 300 MG PO CAPS: 300.0000 mg | ORAL_CAPSULE | Freq: Two times a day (BID) | ORAL | 0 refills | Status: DC

## 2019-11-17 MED ORDER — GUAIFENESIN-CODEINE 100-10 MG/5ML PO SOLN: 5.0000 mL | Freq: Four times a day (QID) | ORAL | 0 refills | Status: DC | PRN

## 2019-11-17 MED FILL — CEFDINIR 300 MG CAPSULE: 300 | 10 days supply | Qty: 20 | Fill #0

## 2019-11-17 MED FILL — GUAIATUSSIN AC LIQUID: 100-10 | 6 days supply | Qty: 240 | Fill #0

## 2019-11-24 DIAGNOSIS — H5203 Hypermetropia, bilateral: Secondary | ICD-10-CM | POA: Diagnosis not present

## 2019-11-24 DIAGNOSIS — H52223 Regular astigmatism, bilateral: Secondary | ICD-10-CM | POA: Diagnosis not present

## 2020-01-25 ENCOUNTER — Encounter: Payer: 59 | Admitting: Internal Medicine

## 2020-01-28 ENCOUNTER — Other Ambulatory Visit: Payer: Self-pay | Admitting: Internal Medicine

## 2020-01-28 ENCOUNTER — Telehealth: Payer: Self-pay | Admitting: Internal Medicine

## 2020-01-28 MED ORDER — SACCHAROMYCES BOULARDII 250 MG PO CAPS: 250.0000 mg | ORAL_CAPSULE | Freq: Two times a day (BID) | ORAL | 1 refills | Status: DC

## 2020-01-28 MED ORDER — METRONIDAZOLE 500 MG PO TABS: 500.0000 mg | ORAL_TABLET | Freq: Three times a day (TID) | ORAL | 1 refills | Status: DC

## 2020-01-28 MED FILL — METRONIDAZOLE 500 MG TABS: 500 | 10 days supply | Qty: 30 | Fill #0

## 2020-01-28 NOTE — Telephone Encounter (Signed)
Hey! Can you write me an antibiotic to get my GI bacteria back under control like you did last time? I'm starting to have some issues again.    Flagyl and Florastor Rx

## 2020-02-09 MED FILL — METRONIDAZOLE 500 MG TABS: 500 | 10 days supply | Qty: 30 | Fill #0

## 2020-02-09 MED FILL — FLORASTOR 250 MG CAPSULE: 250 | 10 days supply | Qty: 20 | Fill #0

## 2020-02-17 ENCOUNTER — Encounter: Payer: Self-pay | Admitting: Internal Medicine

## 2020-03-22 ENCOUNTER — Other Ambulatory Visit (HOSPITAL_BASED_OUTPATIENT_CLINIC_OR_DEPARTMENT_OTHER): Payer: Self-pay

## 2020-04-14 ENCOUNTER — Other Ambulatory Visit (HOSPITAL_COMMUNITY): Payer: Self-pay

## 2020-04-14 ENCOUNTER — Telehealth: Payer: Self-pay | Admitting: Internal Medicine

## 2020-04-14 ENCOUNTER — Telehealth: Payer: Self-pay | Admitting: *Deleted

## 2020-04-14 MED ORDER — MUPIROCIN 2 % EX OINT: TOPICAL_OINTMENT | CUTANEOUS | 0 refills | Status: DC

## 2020-04-14 MED ORDER — MUPIROCIN 2 % EX OINT
TOPICAL_OINTMENT | CUTANEOUS | 0 refills | Status: DC
  Filled 2020-04-14: qty 44, fill #0

## 2020-04-14 NOTE — Telephone Encounter (Signed)
Baby has MRSA See Rx

## 2020-04-14 NOTE — Telephone Encounter (Signed)
t states MD sent rx to [facility] outpatient. Need rx [facility] to [facility] [place]. Inform [facility] to [facility]mb

## 2020-05-23 LAB — HM PAP SMEAR

## 2020-05-25 ENCOUNTER — Other Ambulatory Visit: Payer: Self-pay | Admitting: Internal Medicine

## 2020-05-25 DIAGNOSIS — L089 Local infection of the skin and subcutaneous tissue, unspecified: Secondary | ICD-10-CM | POA: Insufficient documentation

## 2020-05-25 MED ORDER — SULFAMETHOXAZOLE-TRIMETHOPRIM 800-160 MG PO TABS: 1.0000 | ORAL_TABLET | Freq: Two times a day (BID) | ORAL | 0 refills | Status: AC

## 2020-07-10 ENCOUNTER — Emergency Department (HOSPITAL_COMMUNITY)
Admission: EM | Admit: 2020-07-10 | Discharge: 2020-07-10 | Disposition: A | Discharge status: Home / Self Care | Payer: No Typology Code available for payment source | Attending: Emergency Medicine | Admitting: Emergency Medicine

## 2020-07-10 ENCOUNTER — Other Ambulatory Visit: Payer: Self-pay

## 2020-07-10 ENCOUNTER — Emergency Department (HOSPITAL_COMMUNITY): Payer: No Typology Code available for payment source

## 2020-07-10 DIAGNOSIS — Z87891 Personal history of nicotine dependence: Secondary | ICD-10-CM | POA: Diagnosis not present

## 2020-07-10 DIAGNOSIS — R197 Diarrhea, unspecified: Secondary | ICD-10-CM | POA: Diagnosis not present

## 2020-07-10 DIAGNOSIS — R140 Abdominal distension (gaseous): Secondary | ICD-10-CM | POA: Insufficient documentation

## 2020-07-10 DIAGNOSIS — R109 Unspecified abdominal pain: Secondary | ICD-10-CM | POA: Diagnosis not present

## 2020-07-10 LAB — LIPASE, BLOOD: Lipase: 28 U/L (ref 11–51)

## 2020-07-10 LAB — COMPREHENSIVE METABOLIC PANEL
ALT: 22 U/L (ref 0–44)
AST: 31 U/L (ref 15–41)
Albumin: 3.4 g/dL — ABNORMAL LOW (ref 3.5–5.0)
Alkaline Phosphatase: 68 U/L (ref 38–126)
Anion gap: 7 (ref 5–15)
BUN: 10 mg/dL (ref 6–20)
CO2: 23 mmol/L (ref 22–32)
Calcium: 8.5 mg/dL — ABNORMAL LOW (ref 8.9–10.3)
Chloride: 107 mmol/L (ref 98–111)
Creatinine, Ser: 0.67 mg/dL (ref 0.44–1.00)
GFR, Estimated: >60 mL/min (ref >60)
Glucose, Bld: 103 mg/dL — ABNORMAL HIGH (ref 70–99)
Potassium: 3.5 mmol/L (ref 3.5–5.1)
Sodium: 137 mmol/L (ref 135–145)
Total Bilirubin: 0.5 mg/dL (ref 0.3–1.2)
Total Protein: 7 g/dL (ref 6.5–8.1)

## 2020-07-10 LAB — CBC
HCT: 43.4 % (ref 36.0–46.0)
Hemoglobin: 14 g/dL (ref 12.0–15.0)
MCH: 29.7 pg (ref 26.0–34.0)
MCHC: 32.3 g/dL (ref 30.0–36.0)
MCV: 92.1 fL (ref 80.0–100.0)
Platelets: 257 10*3/uL (ref 150–400)
RBC: 4.71 MIL/uL (ref 3.87–5.11)
RDW: 13.2 % (ref 11.5–15.5)
WBC: 6.8 10*3/uL (ref 4.0–10.5)
nRBC: 0 % (ref 0.0–0.2)

## 2020-07-10 LAB — POC URINE PREG, ED: Preg Test, Ur: NEGATIVE

## 2020-07-10 MED ORDER — LOPERAMIDE HCL 2 MG PO CAPS
4.0000 mg | ORAL_CAPSULE | Freq: Once | ORAL | Status: AC
  Administered 2020-07-10: 4 mg via ORAL
  Filled 2020-07-10: qty 2

## 2020-07-10 MED ORDER — LOPERAMIDE HCL 2 MG PO CAPS: 2.0000 mg | ORAL_CAPSULE | Freq: Four times a day (QID) | ORAL | 0 refills | Status: DC | PRN

## 2020-07-10 MED ORDER — IOHEXOL 300 MG/ML  SOLN
100.0000 mL | Freq: Once | INTRAMUSCULAR | Status: AC | PRN
  Administered 2020-07-10: 100 mL via INTRAVENOUS

## 2020-07-10 MED ORDER — KETOROLAC TROMETHAMINE 15 MG/ML IJ SOLN
15.0000 mg | Freq: Once | INTRAMUSCULAR | Status: AC
  Administered 2020-07-10: 15 mg via INTRAVENOUS
  Filled 2020-07-10: qty 1

## 2020-07-10 MED ORDER — SODIUM CHLORIDE 0.9 % IV BOLUS
1000.0000 mL | Freq: Once | INTRAVENOUS | Status: AC
  Administered 2020-07-10: 1000 mL via INTRAVENOUS

## 2020-07-10 MED ORDER — SULFAMETHOXAZOLE-TRIMETHOPRIM 800-160 MG PO TABS: 1.0000 | ORAL_TABLET | Freq: Two times a day (BID) | ORAL | 0 refills | Status: AC

## 2020-07-10 NOTE — ED Triage Notes (Signed)
Pt c/o watery stool since 0530am, states she feels "drained." Endorses 10/10 abd pain "sore, swollen & irritated"

## 2020-07-10 NOTE — Discharge Instructions (Addendum)
Call your primary care doctor or specialist as discussed in the next 2-3 days.   Return immediately back to the ER if:  Your symptoms worsen within the next 12-24 hours. You develop new symptoms such as new fevers, persistent vomiting, new pain, shortness of breath, or new weakness or numbness, or if you have any other concerns.  

## 2020-07-10 NOTE — ED Provider Notes (Signed)
[facility] EMERGENCY DEPARTMENT Provider Note   CSN: [id number] Arrival date & time: 07/10/20  0030     History Chief Complaint  Patient presents with   Abdominal Pain    Norma White is a 35 y.o. female.  Patient presents with diarrhea all day for 1 day.  Multiple episodes nonbloody diarrhea.  She states her mother had similar episodes 2 days ago and her boyfriend is having some abdominal discomfort.  She denies vomiting denies fevers denies cough.  Complaining of abdominal bloating and abdominal pain as well.      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   Nonallopathic lesion of thoracic region 06/16/2019   Nonallopathic lesion of cervical region 06/16/2019   Nonallopathic lesion of rib cage 06/16/2019   Nonallopathic lesion of lumbar region 06/16/2019   Nonallopathic lesion of sacral region 06/16/2019   Term pregnancy 03/18/2019   Pregnancy 01/20/2019   Right carpal tunnel syndrome 11/25/2018   Closed fracture subluxation of lunate bone of right wrist, initial encounter 06/18/2018   Well adult exam 01/14/2017   IBS (irritable bowel syndrome) 09/05/2016    Past Surgical History:  Procedure Laterality Date   TOOTH EXTRACTION       OB History     Gravida  1   Para  1   Term  1   Preterm      AB      Living  1      SAB      IAB      Ectopic      Multiple  0   Live Births  1           Family History  Problem Relation Age of Onset   Heart attack Mother    Heart disease Mother    Hypertension Mother    Sjogren's syndrome Mother    Lupus Mother    GER disease Mother    Arthritis Mother    Hypertension Father    Seizures Father    Leukemia Maternal Grandmother    Epilepsy Maternal Grandfather     Social History   Tobacco Use   Smoking status: Former     Pack years: 0.00    Types: Cigarettes    Quit date: 03/06/2018    Years since quitting: 2.3   Smokeless tobacco: Never  Vaping Use   Vaping Use: Never used  Substance Use Topics   Alcohol use: Yes   Drug use: No    Home Medications Prior to Admission medications   Medication Sig Start Date End Date Taking? Authorizing Provider  levonorgestrel (MIRENA) 20 MCG/DAY IUD 1 each by Intrauterine route once.   Yes [provider]  loperamide (IMODIUM) 2 MG capsule Take 1 capsule (2 mg total) by mouth 4 (four) times daily as needed for diarrhea or loose stools. 07/10/20  Yes Cheryll Cockayne, MD  naproxen sodium (ALEVE) 220 MG tablet Take 440 mg by mouth daily as needed (headache).   Yes [provider]  sulfamethoxazole-trimethoprim (BACTRIM DS) 800-160 MG tablet Take 1 tablet by mouth 2 (two) times daily for 2 days. 07/10/20 07/12/20 Yes Cheryll Cockayne, MD  mupirocin ointment (BACTROBAN) 2 % In nares and on fingertips bid for 5-7 days Patient not taking: Reported on 07/10/2020 04/14/20   Plotnikov, Georgina Quint,  MD  saccharomyces boulardii (FLORASTOR) 250 MG capsule TAKE 1 CAPSULE BY MOUTH 2 TIMES DAILY AFTER FINISHING METRONIDAZOLE Patient not taking: Reported on 07/10/2020 01/28/20 01/27/21  Plotnikov, Georgina Quint, MD    Allergies    Patient has no known allergies.  Review of Systems   Review of Systems  Constitutional:  Negative for fever.  HENT:  Negative for ear pain.   Eyes:  Negative for pain.  Respiratory:  Negative for cough.   Cardiovascular:  Negative for chest pain.  Gastrointestinal:  Positive for abdominal pain.  Genitourinary:  Negative for flank pain.  Musculoskeletal:  Negative for back pain.  Skin:  Negative for rash.  Neurological:  Negative for headaches.   Physical Exam Updated Vital Signs BP 125/67 (BP Location: Right Arm)   Pulse 85   Temp 98.1 F (36.7 C) (Oral)   Resp 19   SpO2 99%   Physical Exam Constitutional:      General: She is not in  acute distress.    Appearance: Normal appearance.  HENT:     Head: Normocephalic.     Nose: Nose normal.  Eyes:     Extraocular Movements: Extraocular movements intact.  Cardiovascular:     Rate and Rhythm: Normal rate.  Pulmonary:     Effort: Pulmonary effort is normal.  Abdominal:     Comments: Mild diffuse abdominal discomfort on palpation.  No guarding or rebound.  Musculoskeletal:        General: Normal range of motion.     Cervical back: Normal range of motion.  Neurological:     General: No focal deficit present.     Mental Status: She is alert. Mental status is at baseline.    ED Results / Procedures / Treatments   Labs (all labs ordered are listed, but only abnormal results are displayed) Labs Reviewed  COMPREHENSIVE METABOLIC PANEL - Abnormal; Notable for the following components:      Result Value   Glucose, Bld 103 (*)    Calcium 8.5 (*)    Albumin 3.4 (*)    All other components within normal limits  LIPASE, BLOOD  CBC  URINALYSIS, ROUTINE W REFLEX MICROSCOPIC  POC URINE PREG, ED  I-STAT BETA HCG BLOOD, ED ([facility], [facility], [facility] ONLY)    EKG None  Radiology CT Abdomen Pelvis W Contrast  Result Date: 07/10/2020 CLINICAL DATA:  Watery stools.  Acute abdominal pain EXAM: CT ABDOMEN AND PELVIS WITH CONTRAST TECHNIQUE: Multidetector CT imaging of the abdomen and pelvis was performed using the standard protocol following bolus administration of intravenous contrast. CONTRAST:  OMNIPAQUE IOHEXOL 300 MG/ML  SOLN COMPARISON:  None. FINDINGS: Lower chest:  No contributory findings. Hepatobiliary: Subcentimeter low-density in the anterior right lobe liver which is too small for densitometry.No evidence of biliary obstruction or stone. Pancreas: Unremarkable. Spleen: Unremarkable. Adrenals/Urinary Tract: 17 mm low-density right adrenal nodule measuring -2 Hounsfield unit. No hydronephrosis or stone. Unremarkable bladder. Stomach/Bowel:  No obstruction. No appendicitis.  Vascular/Lymphatic: No acute vascular abnormality. No mass or adenopathy. Reproductive:IUD in expected position. Other: No ascites or pneumoperitoneum. Musculoskeletal: No acute abnormalities. IMPRESSION: 1. No acute finding.  No visible bowel inflammation. 2. Right adrenal adenoma. Electronically Signed   By: Marnee Spring M.D.   On: 07/10/2020 05:42    Procedures Procedures   Medications Ordered in ED Medications  loperamide (IMODIUM) capsule 4 mg (has no administration in time range)  sodium chloride 0.9 % bolus 1,000 mL (1,000 mLs Intravenous New Bag/Given 07/10/20 0416)  ketorolac (TORADOL) 15 MG/ML injection 15 mg (15 mg Intravenous Given 07/10/20 0504)  iohexol (OMNIPAQUE) 300 MG/ML solution 100 mL (100 mLs Intravenous Contrast Given 07/10/20 0516)    ED Course  I have reviewed the triage vital signs and the nursing notes.  Pertinent labs & imaging results that were available during my care of the patient were reviewed by me and considered in my medical decision making (see chart for details).    MDM Rules/Calculators/A&P                          Labs unremarkable white count normal chemistry normal urine pregnancy negative.  Vital signs within normal limits.  CT L and pelvis is unremarkable.  Patient given Imodium for symptom management.  Advised outpatient follow-up with her doctors in 2 or 3 days.  Advised immediate return for if she is unable to keep down any fluids worsening symptoms or fevers or any additional concerns.  Final Clinical Impression(s) / ED Diagnoses Final diagnoses:  Diarrhea, unspecified type    Rx / DC Orders ED Discharge Orders          Ordered    loperamide (IMODIUM) 2 MG capsule  4 times daily PRN        07/10/20 0623    sulfamethoxazole-trimethoprim (BACTRIM DS) 800-160 MG tablet  2 times daily        07/10/20 0623             Cheryll Cockayne, MD 07/10/20 [phone]

## 2020-07-25 ENCOUNTER — Other Ambulatory Visit: Payer: Self-pay

## 2020-07-25 ENCOUNTER — Encounter: Payer: Self-pay | Admitting: Internal Medicine

## 2020-07-25 ENCOUNTER — Ambulatory Visit (INDEPENDENT_AMBULATORY_CARE_PROVIDER_SITE_OTHER): Payer: No Typology Code available for payment source | Admitting: Internal Medicine

## 2020-07-25 VITALS — BP 120/72 | HR 62 | Temp 98.1°F | Resp 16 | Ht 67.5 in | Wt 223.0 lb

## 2020-07-25 DIAGNOSIS — E669 Obesity, unspecified: Secondary | ICD-10-CM

## 2020-07-25 DIAGNOSIS — E559 Vitamin D deficiency, unspecified: Secondary | ICD-10-CM | POA: Diagnosis not present

## 2020-07-25 DIAGNOSIS — Z0000 Encounter for general adult medical examination without abnormal findings: Secondary | ICD-10-CM | POA: Insufficient documentation

## 2020-07-25 DIAGNOSIS — R768 Other specified abnormal immunological findings in serum: Secondary | ICD-10-CM | POA: Diagnosis not present

## 2020-07-25 LAB — HEMOGLOBIN A1C: Hgb A1c MFr Bld: 5.5 % (ref 4.6–6.5)

## 2020-07-25 LAB — TSH: TSH: 0.87 u[IU]/mL (ref 0.35–5.50)

## 2020-07-25 LAB — VITAMIN D 25 HYDROXY (VIT D DEFICIENCY, FRACTURES): VITD: 19.14 ng/mL — ABNORMAL LOW (ref 30.00–100.00)

## 2020-07-25 MED ORDER — CHOLECALCIFEROL 1.25 MG (50000 UT) PO CAPS: 50000.0000 [IU] | ORAL_CAPSULE | ORAL | 0 refills | Status: DC

## 2020-07-25 MED ORDER — INSULIN PEN NEEDLE 32G X 6 MM MISC: 1.0000 | 0 refills | Status: DC

## 2020-07-25 MED ORDER — WEGOVY 0.25 MG/0.5ML ~~LOC~~ SOAJ: 0.2500 mg | SUBCUTANEOUS | 0 refills | Status: DC

## 2020-07-25 NOTE — Progress Notes (Signed)
[facility]   Subjective:  Patient ID: Norma White, female    DOB: Feb 19, 1985  Age: 35 y.o. MRN: [id number]  CC: Annual Exam  This visit occurred during the SARS-CoV-2 public health emergency.  Safety protocols were in place, including screening questions prior to the visit, additional usage of staff PPE, and extensive cleaning of exam room while observing appropriate contact time as indicated for disinfecting solutions.    HPI Norma White presents for a CPX and f/up -   She would like to use a medication to help her lose weight.  She has tried lifestyle modifications without much success.  Outpatient Medications Prior to Visit  Medication Sig Dispense Refill   levonorgestrel (MIRENA) 20 MCG/DAY IUD 1 each by Intrauterine route once.     naproxen sodium (ALEVE) 220 MG tablet Take 440 mg by mouth daily as needed (headache).     loperamide (IMODIUM) 2 MG capsule Take 1 capsule (2 mg total) by mouth 4 (four) times daily as needed for diarrhea or loose stools. 12 capsule 0   mupirocin ointment (BACTROBAN) 2 % In nares and on fingertips bid for 5-7 days (Patient not taking: Reported on 07/10/2020) 30 g 0   saccharomyces boulardii (FLORASTOR) 250 MG capsule TAKE 1 CAPSULE BY MOUTH 2 TIMES DAILY AFTER FINISHING METRONIDAZOLE (Patient not taking: Reported on 07/10/2020) 30 capsule 1   No facility-administered medications prior to visit.    ROS Review of Systems  Constitutional:  Negative for appetite change, fatigue and unexpected weight change.  HENT: Negative.    Eyes: Negative.   Respiratory: Negative.  Negative for chest tightness, shortness of breath and wheezing.   Cardiovascular: Negative.  Negative for chest pain, palpitations and leg swelling.  Gastrointestinal:  Negative for abdominal pain, constipation, diarrhea, nausea and vomiting.  Endocrine: Negative.   Genitourinary: Negative.  Negative for difficulty urinating.  Skin:  Negative for rash.  Neurological:  Negative for dizziness,  weakness, light-headedness and headaches.  Hematological:  Negative for adenopathy. Does not bruise/bleed easily.  Psychiatric/Behavioral: Negative.     Objective:  BP 120/72 (BP Location: Left Arm, Patient Position: Sitting, Cuff Size: Large)   Pulse 62   Temp 98.1 F (36.7 C) (Oral)   Resp 16   Ht 5' 7.5" (1.715 m)   Wt 223 lb (101.2 kg)   SpO2 97%   BMI 34.41 kg/m   BP Readings from Last 3 Encounters:  07/25/20 120/72  07/10/20 124/71  06/16/19 112/80    Wt Readings from Last 3 Encounters:  07/25/20 223 lb (101.2 kg)  06/16/19 215 lb (97.5 kg)  03/18/19 255 lb 8 oz (115.9 kg)    Physical Exam Vitals reviewed.  HENT:     Nose: Nose normal.     Mouth/Throat:     Mouth: Mucous membranes are moist.  Eyes:     General: No scleral icterus.    Conjunctiva/sclera: Conjunctivae normal.  Cardiovascular:     Rate and Rhythm: Normal rate and regular rhythm.     Heart sounds: No murmur heard. Pulmonary:     Effort: Pulmonary effort is normal.     Breath sounds: No stridor. No wheezing, rhonchi or rales.  Abdominal:     General: Abdomen is flat. Bowel sounds are normal. There is no distension.     Palpations: Abdomen is soft. There is no hepatomegaly, splenomegaly or mass.     Tenderness: There is no abdominal tenderness.  Musculoskeletal:        General: Normal range of motion.  Cervical back: Neck supple.     Right lower leg: No edema.     Left lower leg: No edema.  Lymphadenopathy:     Cervical: No cervical adenopathy.  Skin:    General: Skin is warm and dry.  Neurological:     General: No focal deficit present.     Mental Status: She is alert.  Psychiatric:        Mood and Affect: Mood normal.        Behavior: Behavior normal.    Lab Results  Component Value Date   WBC 6.8 07/10/2020   HGB 14.0 07/10/2020   HCT 43.4 07/10/2020   PLT 257 07/10/2020   GLUCOSE 103 (H) 07/10/2020   CHOL 202 (H) 01/20/2019   TRIG 105.0 01/20/2019   HDL 67.50 01/20/2019    LDLCALC 114 (H) 01/20/2019   ALT 22 07/10/2020   AST 31 07/10/2020   NA 137 07/10/2020   K 3.5 07/10/2020   CL 107 07/10/2020   CREATININE 0.67 07/10/2020   BUN 10 07/10/2020   CO2 23 07/10/2020   TSH 0.87 07/25/2020   HGBA1C 5.5 07/25/2020    CT Abdomen Pelvis W Contrast  Result Date: 07/10/2020 CLINICAL DATA:  Watery stools.  Acute abdominal pain EXAM: CT ABDOMEN AND PELVIS WITH CONTRAST TECHNIQUE: Multidetector CT imaging of the abdomen and pelvis was performed using the standard protocol following bolus administration of intravenous contrast. CONTRAST:  OMNIPAQUE IOHEXOL 300 MG/ML  SOLN COMPARISON:  None. FINDINGS: Lower chest:  No contributory findings. Hepatobiliary: Subcentimeter low-density in the anterior right lobe liver which is too small for densitometry.No evidence of biliary obstruction or stone. Pancreas: Unremarkable. Spleen: Unremarkable. Adrenals/Urinary Tract: 17 mm low-density right adrenal nodule measuring -2 Hounsfield unit. No hydronephrosis or stone. Unremarkable bladder. Stomach/Bowel:  No obstruction. No appendicitis. Vascular/Lymphatic: No acute vascular abnormality. No mass or adenopathy. Reproductive:IUD in expected position. Other: No ascites or pneumoperitoneum. Musculoskeletal: No acute abnormalities. IMPRESSION: 1. No acute finding.  No visible bowel inflammation. 2. Right adrenal adenoma. Electronically Signed   By: Marnee Spring M.D.   On: 07/10/2020 05:42    Assessment & Plan:   Ki was seen today for annual exam.  Diagnoses and all orders for this visit:  Obesity (BMI 30.0-34.9)- Labs are negative or for secondary causes or complications.  In addition to lifestyle modifications I have recommended she start using a GLP-1 agonist. -     Hemoglobin A1c; Future -     TSH; Future -     VITAMIN D 25 Hydroxy (Vit-D Deficiency, Fractures); Future -     VITAMIN D 25 Hydroxy (Vit-D Deficiency, Fractures) -     TSH -     Hemoglobin A1c -      Semaglutide-Weight Management (WEGOVY) 0.25 MG/0.5ML SOAJ; Inject 0.25 mg into the skin once a week. -     Insulin Pen Needle 32G X 6 MM MISC; 1 Act by Does not apply route once a week.  Routine general medical examination at a health care facility- Exam completed, labs ordered, cancer screenings are up-to-date, vaccines are up-to-date, patient education was given. -     Hepatitis C antibody; Future -     Hepatitis C antibody  Vitamin D deficiency disease -     Cholecalciferol 1.25 MG (50000 UT) capsule; Take 1 capsule (50,000 Units total) by mouth once a week.  I have discontinued Dalyla Badon's saccharomyces boulardii, mupirocin ointment, and loperamide. I am also having her start on Cholecalciferol, Wegovy,  and Insulin Pen Needle. Additionally, I am having her maintain her naproxen sodium and levonorgestrel.  Meds ordered this encounter  Medications   Cholecalciferol 1.25 MG (50000 UT) capsule    Sig: Take 1 capsule (50,000 Units total) by mouth once a week.    Dispense:  12 capsule    Refill:  0   Semaglutide-Weight Management (WEGOVY) 0.25 MG/0.5ML SOAJ    Sig: Inject 0.25 mg into the skin once a week.    Dispense:  2 mL    Refill:  0   Insulin Pen Needle 32G X 6 MM MISC    Sig: 1 Act by Does not apply route once a week.    Dispense:  50 each    Refill:  0      Follow-up: No follow-ups on file.  Sanda Linger, MD

## 2020-07-26 ENCOUNTER — Encounter: Payer: Self-pay | Admitting: Internal Medicine

## 2020-07-28 ENCOUNTER — Other Ambulatory Visit: Payer: Self-pay | Admitting: Internal Medicine

## 2020-07-28 DIAGNOSIS — E669 Obesity, unspecified: Secondary | ICD-10-CM

## 2020-07-28 MED ORDER — OZEMPIC (0.25 OR 0.5 MG/DOSE) 2 MG/1.5ML ~~LOC~~ SOPN: 0.2500 mg | PEN_INJECTOR | SUBCUTANEOUS | 0 refills | Status: DC

## 2020-07-29 ENCOUNTER — Encounter: Payer: Self-pay | Admitting: Internal Medicine

## 2020-07-29 DIAGNOSIS — R768 Other specified abnormal immunological findings in serum: Secondary | ICD-10-CM | POA: Insufficient documentation

## 2020-07-29 LAB — HEPATITIS C ANTIBODY
Hepatitis C Ab: REACTIVE — AB
SIGNAL TO CUT-OFF: 10 — ABNORMAL HIGH (ref <1.00)

## 2020-07-29 LAB — HCV RNA,QUANTITATIVE REAL TIME PCR
HCV Quantitative Log: <1.18 Log IU/mL
HCV RNA, PCR, QN: <15 IU/mL

## 2020-07-29 MED ORDER — SAXENDA 18 MG/3ML ~~LOC~~ SOPN
3.0000 mg | PEN_INJECTOR | Freq: Every day | SUBCUTANEOUS | 1 refills | Status: DC
Start: 2020-07-29 — End: 2021-03-06

## 2020-07-29 MED ORDER — INSULIN PEN NEEDLE 32G X 6 MM MISC: 1.0000 | Freq: Every day | 1 refills | Status: DC

## 2020-07-29 NOTE — Addendum Note (Signed)
Addended by: Etta Grandchild on: 07/29/2020 09:22 AM   Modules accepted: Orders

## 2020-08-08 ENCOUNTER — Ambulatory Visit (INDEPENDENT_AMBULATORY_CARE_PROVIDER_SITE_OTHER): Payer: No Typology Code available for payment source | Admitting: Internal Medicine

## 2020-08-08 ENCOUNTER — Encounter: Payer: Self-pay | Admitting: Internal Medicine

## 2020-08-08 ENCOUNTER — Other Ambulatory Visit: Payer: Self-pay

## 2020-08-08 DIAGNOSIS — E669 Obesity, unspecified: Secondary | ICD-10-CM | POA: Diagnosis not present

## 2020-08-08 DIAGNOSIS — L309 Dermatitis, unspecified: Secondary | ICD-10-CM | POA: Insufficient documentation

## 2020-08-08 MED ORDER — TRIAMCINOLONE ACETONIDE 0.1 % EX OINT: 1.0000 "application " | TOPICAL_OINTMENT | Freq: Two times a day (BID) | CUTANEOUS | 1 refills | Status: DC | PRN

## 2020-08-08 MED ORDER — TIRZEPATIDE 2.5 MG/0.5ML ~~LOC~~ SOAJ: 2.5000 mg | SUBCUTANEOUS | 2 refills | Status: DC

## 2020-08-08 MED ORDER — PHENTERMINE HCL 37.5 MG PO TABS: 37.5000 mg | ORAL_TABLET | Freq: Every day | ORAL | 2 refills | Status: DC

## 2020-08-08 NOTE — Patient Instructions (Signed)
The Obesity Code book by Jason Fung   These suggestions will probably help you to improve your metabolism if you are not overweight and to lose weight if you are overweight: 1.  Reduce your consumption of sugars and starches.  Eliminate high fructose corn syrup from your diet.  Reduce your consumption of processed foods.  For desserts try to have seasonal fruits, berries, nuts, cheeses or dark chocolate with more than 70% cacao. 2.  Do not snack 3.  You do not have to eat breakfast.  If you choose to have breakfast - eat plain greek yogurt, eggs, oatmeal (without sugar) - use honey if you need to. 4.  Drink water, freshly brewed unsweetened tea (green, black or herbal) or coffee.  Do not drink sodas including diet sodas , juices, beverages sweetened with artificial sweeteners. 5.  Reduce your consumption of refined grains. 6.  Avoid protein drinks such as Optifast, Slim fast etc. Eat chicken, fish, meat, dairy and beans for your sources of protein. 7.  Natural unprocessed fats like cold pressed virgin olive oil, butter, coconut oil are good for you.  Eat avocados. 8.  Increase your consumption of fiber.  Fruits, berries, vegetables, whole grains, flaxseed, chia seeds, beans, popcorn, nuts, oatmeal are good sources of fiber 9.  Use vinegar in your diet, i.e. apple cider vinegar, red wine or balsamic vinegar 10.  You can try fasting.  For example you can skip breakfast and lunch every other day (24-hour fast) 11.  Stress reduction, good night sleep, relaxation, meditation, yoga and other physical activity is likely to help you to maintain low weight too. 12.  If you drink alcohol, limit your alcohol intake to no more than 2 drinks a day.   Mediterranean diet is good for you. ([employer] has a typical Mediterranean cuisine menu) The Mediterranean diet is a way of eating based on the traditional cuisine of countries bordering the [place] Sea. While there is no single definition of the  Mediterranean diet, it is typically high in vegetables, fruits, whole grains, beans, nut and seeds, and olive oil. The main components of Mediterranean diet include: Daily consumption of vegetables, fruits, whole grains and healthy fats  Weekly intake of fish, poultry, beans and eggs  Moderate portions of dairy products  Limited intake of red meat Other important elements of the Mediterranean diet are sharing meals with family and friends, enjoying a glass of red wine and being physically active. Health benefits of a Mediterranean diet: A traditional Mediterranean diet consisting of large quantities of fresh fruits and vegetables, nuts, fish and olive oil--coupled with physical activity--can reduce your risk of serious mental and physical health problems by: Preventing heart disease and strokes. Following a Mediterranean diet limits your intake of refined breads, processed foods, and red meat, and encourages drinking red wine instead of hard liquor--all factors that can help prevent heart disease and stroke. Keeping you agile. If you're an older adult, the nutrients gained with a Mediterranean diet may reduce your risk of developing muscle weakness and other signs of frailty by about 70 percent. Reducing the risk of Alzheimer's. Research suggests that the Mediterranean diet may improve cholesterol, blood sugar levels, and overall blood vessel health, which in turn may reduce your risk of Alzheimer's disease or dementia. Halving the risk of Parkinson's disease. The high levels of antioxidants in the Mediterranean diet can prevent cells from undergoing a damaging process called oxidative stress, thereby cutting the risk of Parkinson's disease in half. Increasing longevity.   By reducing your risk of developing heart disease or cancer with the Mediterranean diet, you're reducing your risk of death at any age by 20%. Protecting against type 2 diabetes. A Mediterranean diet is rich in fiber which digests  slowly, prevents huge swings in blood sugar, and can help you maintain a healthy weight.    Cabbage soup recipe that will not make you gain weight: Take 1 small head of cabbage, 1 average pack of celery, 4 green peppers, 4 onions, 2 cans diced tomatoes (they are not available without salt), salt and spices to taste.  Chop cabbage, celery, peppers and onions.  And tomatoes and 2-2.5 liters (2.5 quarts) of water so that it would just cover the vegetables.  Bring to boil.  Add spices and salt.  Turn heat to low/medium and simmer for 20-25 minutes.  Naturally, you can make a smaller batch and change some of the ingredients.  

## 2020-08-08 NOTE — Assessment & Plan Note (Addendum)
  Norma White has a Rx for [employer]: She will submitted to the pharmacy to see if it is approved.  In the meantime she will try Adipex.  Potential benefits of a short or long term Adipex use as well as potential risks  and complications were explained to the patient and were aknowledged.  She is not planning pregnancy anytime soon

## 2020-08-08 NOTE — Assessment & Plan Note (Signed)
Chronic.  Triamcinolone cream prescribed.  Take Claritin daily

## 2020-08-08 NOTE — Progress Notes (Signed)
Subjective:  Patient ID: Norma White, female    DOB: 03-08-1985  Age: 35 y.o. MRN: [id number]  CC: Follow-up   HPI Norma White presents for eczema, wt gain and hyperglycemia C/o eczema  Outpatient Medications Prior to Visit  Medication Sig Dispense Refill   Cholecalciferol 1.25 MG (50000 UT) capsule Take 1 capsule (50,000 Units total) by mouth once a week. 12 capsule 0   Insulin Pen Needle 32G X 6 MM MISC 1 Act by Does not apply route daily. 100 each 1   levonorgestrel (MIRENA) 20 MCG/DAY IUD 1 each by Intrauterine route once.     naproxen sodium (ALEVE) 220 MG tablet Take 440 mg by mouth daily as needed (headache).     triamcinolone ointment (KENALOG) 0.1 % Apply 1 application topically 2 (two) times daily as needed.     Liraglutide -Weight Management (SAXENDA) 18 MG/3ML SOPN Inject 3 mg into the skin daily. (Patient not taking: Reported on 08/08/2020) 15 mL 1   Semaglutide,0.25 or 0.5MG /DOS, (OZEMPIC, 0.25 OR 0.5 MG/DOSE,) 2 MG/1.5ML SOPN Inject 0.25 mg into the skin once a week. (Patient not taking: Reported on 08/08/2020) 1.5 mL 0   No facility-administered medications prior to visit.    ROS: Review of Systems  Constitutional:  Positive for unexpected weight change. Negative for activity change, appetite change, chills and fatigue.  HENT:  Negative for congestion, mouth sores and sinus pressure.   Eyes:  Negative for visual disturbance.  Respiratory:  Negative for cough and chest tightness.   Gastrointestinal:  Negative for abdominal pain and nausea.  Genitourinary:  Negative for difficulty urinating, frequency and vaginal pain.  Musculoskeletal:  Negative for back pain and gait problem.  Skin:  Negative for pallor and rash.  Neurological:  Negative for dizziness, tremors, weakness, numbness and headaches.  Psychiatric/Behavioral:  Negative for confusion and sleep disturbance.    Objective:  BP 122/82 (BP Location: Left Arm)   Pulse 67   Temp 98.2 F (36.8 C) (Oral)    Ht 5' 7.5" (1.715 m)   Wt 227 lb (103 kg)   SpO2 97%   BMI 35.03 kg/m   BP Readings from Last 3 Encounters:  08/08/20 122/82  07/25/20 120/72  07/10/20 124/71    Wt Readings from Last 3 Encounters:  08/08/20 227 lb (103 kg)  07/25/20 223 lb (101.2 kg)  06/16/19 215 lb (97.5 kg)    Physical Exam Constitutional:      General: She is not in acute distress.    Appearance: She is well-developed. She is obese.  HENT:     Head: Normocephalic.     Right Ear: External ear normal.     Left Ear: External ear normal.     Nose: Nose normal.  Eyes:     General:        Right eye: No discharge.        Left eye: No discharge.     Conjunctiva/sclera: Conjunctivae normal.     Pupils: Pupils are equal, round, and reactive to light.  Neck:     Thyroid: No thyromegaly.     Vascular: No JVD.     Trachea: No tracheal deviation.  Cardiovascular:     Rate and Rhythm: Normal rate and regular rhythm.     Heart sounds: Normal heart sounds.  Pulmonary:     Effort: No respiratory distress.     Breath sounds: No stridor. No wheezing.  Abdominal:     General: Bowel sounds are normal. There is no  distension.     Palpations: Abdomen is soft. There is no mass.     Tenderness: There is no abdominal tenderness. There is no guarding or rebound.  Musculoskeletal:        General: No tenderness.     Cervical back: Normal range of motion and neck supple. No rigidity.  Lymphadenopathy:     Cervical: No cervical adenopathy.  Skin:    Findings: No erythema or rash.  Neurological:     Mental Status: She is oriented to person, place, and time.     Cranial Nerves: No cranial nerve deficit.     Motor: No abnormal muscle tone.     Coordination: Coordination normal.     Deep Tendon Reflexes: Reflexes normal.  Psychiatric:        Behavior: Behavior normal.        Thought Content: Thought content normal.        Judgment: Judgment normal.    Lab Results  Component Value Date   WBC 6.8 07/10/2020   HGB  14.0 07/10/2020   HCT 43.4 07/10/2020   PLT 257 07/10/2020   GLUCOSE 103 (H) 07/10/2020   CHOL 202 (H) 01/20/2019   TRIG 105.0 01/20/2019   HDL 67.50 01/20/2019   LDLCALC 114 (H) 01/20/2019   ALT 22 07/10/2020   AST 31 07/10/2020   NA 137 07/10/2020   K 3.5 07/10/2020   CL 107 07/10/2020   CREATININE 0.67 07/10/2020   BUN 10 07/10/2020   CO2 23 07/10/2020   TSH 0.87 07/25/2020   HGBA1C 5.5 07/25/2020    CT Abdomen Pelvis W Contrast  Result Date: 07/10/2020 CLINICAL DATA:  Watery stools.  Acute abdominal pain EXAM: CT ABDOMEN AND PELVIS WITH CONTRAST TECHNIQUE: Multidetector CT imaging of the abdomen and pelvis was performed using the standard protocol following bolus administration of intravenous contrast. CONTRAST:  OMNIPAQUE IOHEXOL 300 MG/ML  SOLN COMPARISON:  None. FINDINGS: Lower chest:  No contributory findings. Hepatobiliary: Subcentimeter low-density in the anterior right lobe liver which is too small for densitometry.No evidence of biliary obstruction or stone. Pancreas: Unremarkable. Spleen: Unremarkable. Adrenals/Urinary Tract: 17 mm low-density right adrenal nodule measuring -2 Hounsfield unit. No hydronephrosis or stone. Unremarkable bladder. Stomach/Bowel:  No obstruction. No appendicitis. Vascular/Lymphatic: No acute vascular abnormality. No mass or adenopathy. Reproductive:IUD in expected position. Other: No ascites or pneumoperitoneum. Musculoskeletal: No acute abnormalities. IMPRESSION: 1. No acute finding.  No visible bowel inflammation. 2. Right adrenal adenoma. Electronically Signed   By: Marnee Spring M.D.   On: 07/10/2020 05:42    Assessment & Plan:     Sonda Primes, MD

## 2020-09-19 ENCOUNTER — Telehealth: Payer: Self-pay | Admitting: Internal Medicine

## 2020-09-19 DIAGNOSIS — K649 Unspecified hemorrhoids: Secondary | ICD-10-CM

## 2020-09-19 MED ORDER — TRIAMCINOLONE ACETONIDE 0.5 % EX OINT: 1.0000 "application " | TOPICAL_OINTMENT | Freq: Four times a day (QID) | CUTANEOUS | 1 refills | Status: DC

## 2020-09-19 MED ORDER — HYDROCORTISONE ACETATE 25 MG RE SUPP: 25.0000 mg | Freq: Two times a day (BID) | RECTAL | 1 refills | Status: AC

## 2020-09-19 NOTE — Telephone Encounter (Signed)
C/o int/ext hemorrhoids GI ref Triamc oint Proctocort

## 2020-10-21 ENCOUNTER — Ambulatory Visit: Payer: No Typology Code available for payment source | Admitting: Physician Assistant

## 2020-11-02 ENCOUNTER — Other Ambulatory Visit: Payer: Self-pay | Admitting: Internal Medicine

## 2020-11-02 MED ORDER — HYDROQUINONE 4 % EX CREA: TOPICAL_CREAM | Freq: Two times a day (BID) | CUTANEOUS | 1 refills | Status: DC

## 2020-12-19 ENCOUNTER — Other Ambulatory Visit: Payer: Self-pay | Admitting: Internal Medicine

## 2020-12-19 MED ORDER — DOXYCYCLINE HYCLATE 100 MG PO TABS: 100.0000 mg | ORAL_TABLET | Freq: Two times a day (BID) | ORAL | 0 refills | Status: DC

## 2020-12-19 NOTE — Progress Notes (Signed)
A small boil Rx - Doxy

## 2020-12-29 ENCOUNTER — Ambulatory Visit: Payer: No Typology Code available for payment source | Admitting: Family Medicine

## 2021-02-28 ENCOUNTER — Other Ambulatory Visit: Payer: Self-pay | Admitting: Internal Medicine

## 2021-02-28 MED ORDER — IBUPROFEN-FAMOTIDINE 800-26.6 MG PO TABS: 1.0000 | ORAL_TABLET | Freq: Two times a day (BID) | ORAL | 1 refills | Status: DC | PRN

## 2021-02-28 NOTE — Progress Notes (Signed)
Needs rx

## 2021-03-06 ENCOUNTER — Other Ambulatory Visit: Payer: Self-pay

## 2021-03-06 ENCOUNTER — Telehealth: Payer: Self-pay

## 2021-03-06 NOTE — Telephone Encounter (Signed)
PA started for Ibuprofen-Famotidine 800-26.6MG  tablets ? ?(Key: [id number]) ?

## 2021-03-13 ENCOUNTER — Other Ambulatory Visit: Payer: Self-pay | Admitting: Internal Medicine

## 2021-03-13 MED ORDER — FAMOTIDINE 20 MG PO TABS
40.0000 mg | ORAL_TABLET | Freq: Every day | ORAL | 3 refills | Status: DC | PRN
Start: 2021-03-13 — End: 2021-10-11

## 2021-03-13 MED ORDER — IBUPROFEN 800 MG PO TABS: 800.0000 mg | ORAL_TABLET | Freq: Two times a day (BID) | ORAL | 3 refills | Status: DC | PRN

## 2021-03-13 NOTE — Progress Notes (Signed)
Rx

## 2021-05-11 ENCOUNTER — Telehealth: Payer: Self-pay

## 2021-05-11 NOTE — Telephone Encounter (Signed)
Pt states she is needing Keflex sent over due to her having a UTI. Pt states she is having flank pain, strong odor upon urination. Pt states she is willing to being in a urine sample if needed. ?

## 2021-05-12 MED ORDER — CEPHALEXIN 500 MG PO CAPS: 500.0000 mg | ORAL_CAPSULE | Freq: Four times a day (QID) | ORAL | 1 refills | Status: DC

## 2021-05-12 NOTE — Telephone Encounter (Signed)
Okay.  Done.  Thanks 

## 2021-05-12 NOTE — Addendum Note (Signed)
Addended by: Cassandria Anger on: 05/12/2021 09:16 AM ? ? Modules accepted: Orders ? ?

## 2021-07-12 ENCOUNTER — Telehealth: Payer: Self-pay | Admitting: Internal Medicine

## 2021-07-12 MED ORDER — SACCHAROMYCES BOULARDII 250 MG PO CAPS: 250.0000 mg | ORAL_CAPSULE | Freq: Two times a day (BID) | ORAL | 1 refills | Status: DC

## 2021-07-12 MED ORDER — METRONIDAZOLE 500 MG PO TABS: 500.0000 mg | ORAL_TABLET | Freq: Three times a day (TID) | ORAL | 1 refills | Status: DC

## 2021-07-12 NOTE — Telephone Encounter (Signed)
Hey Dr Demetrius Charity. My GI is messing up again. It feels like when my flora was out of wack. Can you send me in an antibiotic like you did last time to get it back on track?

## 2021-07-31 ENCOUNTER — Ambulatory Visit (INDEPENDENT_AMBULATORY_CARE_PROVIDER_SITE_OTHER): Payer: No Typology Code available for payment source | Admitting: Internal Medicine

## 2021-07-31 ENCOUNTER — Encounter: Payer: Self-pay | Admitting: Internal Medicine

## 2021-07-31 VITALS — BP 126/84 | HR 63 | Temp 97.8°F | Ht 67.5 in | Wt 226.0 lb

## 2021-07-31 DIAGNOSIS — L709 Acne, unspecified: Secondary | ICD-10-CM | POA: Insufficient documentation

## 2021-07-31 DIAGNOSIS — H6120 Impacted cerumen, unspecified ear: Secondary | ICD-10-CM | POA: Insufficient documentation

## 2021-07-31 DIAGNOSIS — Z0000 Encounter for general adult medical examination without abnormal findings: Secondary | ICD-10-CM

## 2021-07-31 DIAGNOSIS — H6121 Impacted cerumen, right ear: Secondary | ICD-10-CM

## 2021-07-31 LAB — COMPREHENSIVE METABOLIC PANEL
ALT: 16 U/L (ref 0–35)
AST: 23 U/L (ref 0–37)
Albumin: 4.1 g/dL (ref 3.5–5.2)
Alkaline Phosphatase: 58 U/L (ref 39–117)
BUN: 11 mg/dL (ref 6–23)
CO2: 26 mEq/L (ref 19–32)
Calcium: 9.1 mg/dL (ref 8.4–10.5)
Chloride: 105 mEq/L (ref 96–112)
Creatinine, Ser: 0.7 mg/dL (ref 0.40–1.20)
GFR: 113.22 mL/min (ref >60.00)
Glucose, Bld: 58 mg/dL — ABNORMAL LOW (ref 70–99)
Potassium: 3.7 mEq/L (ref 3.5–5.1)
Sodium: 139 mEq/L (ref 135–145)
Total Bilirubin: 0.5 mg/dL (ref 0.2–1.2)
Total Protein: 7.9 g/dL (ref 6.0–8.3)

## 2021-07-31 LAB — LIPID PANEL
Cholesterol: 173 mg/dL (ref 0–200)
HDL: 49.8 mg/dL (ref >39.00)
LDL Cholesterol: 113 mg/dL — ABNORMAL HIGH (ref 0–99)
NonHDL: 123.5
Total CHOL/HDL Ratio: 3
Triglycerides: 51 mg/dL (ref 0.0–149.0)
VLDL: 10.2 mg/dL (ref 0.0–40.0)

## 2021-07-31 LAB — URINALYSIS
Bilirubin Urine: NEGATIVE
Hgb urine dipstick: NEGATIVE
Ketones, ur: NEGATIVE
Leukocytes,Ua: NEGATIVE
Nitrite: NEGATIVE
Specific Gravity, Urine: 1.02 (ref 1.000–1.030)
Total Protein, Urine: NEGATIVE
Urine Glucose: NEGATIVE
Urobilinogen, UA: 1 (ref 0.0–1.0)
pH: 7 (ref 5.0–8.0)

## 2021-07-31 LAB — CBC WITH DIFFERENTIAL/PLATELET
Basophils Absolute: 0 10*3/uL (ref 0.0–0.1)
Basophils Relative: 0.8 % (ref 0.0–3.0)
Eosinophils Absolute: 0.1 10*3/uL (ref 0.0–0.7)
Eosinophils Relative: 1.3 % (ref 0.0–5.0)
HCT: 39.6 % (ref 36.0–46.0)
Hemoglobin: 13.3 g/dL (ref 12.0–15.0)
Lymphocytes Relative: 40.3 % (ref 12.0–46.0)
Lymphs Abs: 1.7 10*3/uL (ref 0.7–4.0)
MCHC: 33.6 g/dL (ref 30.0–36.0)
MCV: 89.2 fl (ref 78.0–100.0)
Monocytes Absolute: 0.3 10*3/uL (ref 0.1–1.0)
Monocytes Relative: 8.1 % (ref 3.0–12.0)
Neutro Abs: 2.1 10*3/uL (ref 1.4–7.7)
Neutrophils Relative %: 49.5 % (ref 43.0–77.0)
Platelets: 271 10*3/uL (ref 150.0–400.0)
RBC: 4.44 Mil/uL (ref 3.87–5.11)
RDW: 14.3 % (ref 11.5–15.5)
WBC: 4.2 10*3/uL (ref 4.0–10.5)

## 2021-07-31 LAB — TSH: TSH: 0.62 u[IU]/mL (ref 0.35–5.50)

## 2021-07-31 MED ORDER — TRETINOIN 0.025 % EX CREA
TOPICAL_CREAM | Freq: Every day | CUTANEOUS | 2 refills | Status: DC
Start: 2021-07-31 — End: 2021-10-12

## 2021-07-31 NOTE — Assessment & Plan Note (Signed)
Mild R - irrigate at home 

## 2021-07-31 NOTE — Assessment & Plan Note (Signed)
Better  

## 2021-07-31 NOTE — Progress Notes (Signed)
Subjective:  Patient ID: Norma White, female    DOB: 1985-03-22  Age: 36 y.o. MRN: [id number]  CC: Annual Exam   HPI Lus Swailes presents for well exam  Outpatient Medications Prior to Visit  Medication Sig Dispense Refill   cephALEXin (KEFLEX) 500 MG capsule Take 1 capsule (500 mg total) by mouth 4 (four) times daily. 20 capsule 1   famotidine (PEPCID) 20 MG tablet Take 2 tablets (40 mg total) by mouth daily as needed for heartburn or indigestion. Take w/Ibuprofen 60 tablet 3   hydrocortisone (ANUSOL-HC) 25 MG suppository Place 1 suppository (25 mg total) rectally 2 (two) times daily. 20 suppository 1   hydroquinone 4 % cream Apply topically 2 (two) times daily. 28.35 g 1   ibuprofen (ADVIL) 800 MG tablet Take 1 tablet (800 mg total) by mouth 2 (two) times daily as needed for moderate pain. 60 tablet 3   levonorgestrel (MIRENA) 20 MCG/DAY IUD 1 each by Intrauterine route once.     metroNIDAZOLE (FLAGYL) 500 MG tablet Take 1 tablet (500 mg total) by mouth 3 (three) times daily. 30 tablet 1   saccharomyces boulardii (FLORASTOR) 250 MG capsule Take 1 capsule (250 mg total) by mouth 2 (two) times daily. Take after you finish Flagyl 60 capsule 1   triamcinolone ointment (KENALOG) 0.5 % Apply 1 application topically 4 (four) times daily. 120 g 1   No facility-administered medications prior to visit.    ROS: Review of Systems  Constitutional:  Negative for activity change, appetite change, chills, fatigue and unexpected weight change.  HENT:  Negative for congestion, mouth sores and sinus pressure.   Eyes:  Negative for visual disturbance.  Respiratory:  Negative for cough and chest tightness.   Gastrointestinal:  Negative for abdominal pain and nausea.  Genitourinary:  Negative for difficulty urinating, frequency and vaginal pain.  Musculoskeletal:  Negative for back pain and gait problem.  Skin:  Negative for pallor and rash.  Neurological:  Negative for dizziness, tremors,  weakness, numbness and headaches.  Psychiatric/Behavioral:  Negative for confusion and sleep disturbance.     Objective:  BP 126/84 (BP Location: Left Arm, Patient Position: Sitting, Cuff Size: Large)   Pulse 63   Temp 97.8 F (36.6 C) (Temporal)   Ht 5' 7.5" (1.715 m)   Wt 226 lb (102.5 kg)   SpO2 99%   BMI 34.87 kg/m   BP Readings from Last 3 Encounters:  07/31/21 126/84  08/08/20 122/82  07/25/20 120/72    Wt Readings from Last 3 Encounters:  07/31/21 226 lb (102.5 kg)  08/08/20 227 lb (103 kg)  07/25/20 223 lb (101.2 kg)    Physical Exam Constitutional:      General: She is not in acute distress.    Appearance: She is well-developed.  HENT:     Head: Normocephalic.     Right Ear: External ear normal.     Left Ear: External ear normal.     Nose: Nose normal.  Eyes:     General:        Right eye: No discharge.        Left eye: No discharge.     Conjunctiva/sclera: Conjunctivae normal.     Pupils: Pupils are equal, round, and reactive to light.  Neck:     Thyroid: No thyromegaly.     Vascular: No JVD.     Trachea: No tracheal deviation.  Cardiovascular:     Rate and Rhythm: Normal rate and regular rhythm.  Heart sounds: Normal heart sounds.  Pulmonary:     Effort: No respiratory distress.     Breath sounds: No stridor. No wheezing.  Abdominal:     General: Bowel sounds are normal. There is no distension.     Palpations: Abdomen is soft. There is no mass.     Tenderness: There is no abdominal tenderness. There is no guarding or rebound.  Musculoskeletal:        General: No tenderness.     Cervical back: Normal range of motion and neck supple. No rigidity.  Lymphadenopathy:     Cervical: No cervical adenopathy.  Skin:    Findings: No erythema or rash.  Neurological:     Cranial Nerves: No cranial nerve deficit.     Motor: No abnormal muscle tone.     Coordination: Coordination normal.     Deep Tendon Reflexes: Reflexes normal.  Psychiatric:         Behavior: Behavior normal.        Thought Content: Thought content normal.        Judgment: Judgment normal.   Small wax R ear Mild acne  Lab Results  Component Value Date   WBC 6.8 07/10/2020   HGB 14.0 07/10/2020   HCT 43.4 07/10/2020   PLT 257 07/10/2020   GLUCOSE 103 (H) 07/10/2020   CHOL 202 (H) 01/20/2019   TRIG 105.0 01/20/2019   HDL 67.50 01/20/2019   LDLCALC 114 (H) 01/20/2019   ALT 22 07/10/2020   AST 31 07/10/2020   NA 137 07/10/2020   K 3.5 07/10/2020   CL 107 07/10/2020   CREATININE 0.67 07/10/2020   BUN 10 07/10/2020   CO2 23 07/10/2020   TSH 0.87 07/25/2020   HGBA1C 5.5 07/25/2020    CT Abdomen Pelvis W Contrast  Result Date: 07/10/2020 CLINICAL DATA:  Watery stools.  Acute abdominal pain EXAM: CT ABDOMEN AND PELVIS WITH CONTRAST TECHNIQUE: Multidetector CT imaging of the abdomen and pelvis was performed using the standard protocol following bolus administration of intravenous contrast. CONTRAST:  OMNIPAQUE IOHEXOL 300 MG/ML  SOLN COMPARISON:  None. FINDINGS: Lower chest:  No contributory findings. Hepatobiliary: Subcentimeter low-density in the anterior right lobe liver which is too small for densitometry.No evidence of biliary obstruction or stone. Pancreas: Unremarkable. Spleen: Unremarkable. Adrenals/Urinary Tract: 17 mm low-density right adrenal nodule measuring -2 Hounsfield unit. No hydronephrosis or stone. Unremarkable bladder. Stomach/Bowel:  No obstruction. No appendicitis. Vascular/Lymphatic: No acute vascular abnormality. No mass or adenopathy. Reproductive:IUD in expected position. Other: No ascites or pneumoperitoneum. Musculoskeletal: No acute abnormalities. IMPRESSION: 1. No acute finding.  No visible bowel inflammation. 2. Right adrenal adenoma. Electronically Signed   By: Marnee Spring M.D.   On: 07/10/2020 05:42    Assessment & Plan:   Problem List Items Addressed This Visit     Acne    Better       Relevant Medications    tretinoin (RETIN-A) 0.025 % cream   Cerumen impaction    Mild R - irrigate at home      Well adult exam - Primary     We discussed age appropriate health related issues, including available/recomended screening tests and vaccinations. Labs were ordered to be later reviewed . All questions were answered. We discussed one or more of the following - seat belt use, use of sunscreen/sun exposure exercise, fall risk reduction, second hand smoke exposure, firearm use and storage, seat belt use, a need for adhering to healthy diet and exercise. Labs were  ordered.  All questions were answered. GYN care/IUD per Dr Henderson Cloud       Relevant Orders   TSH   Urinalysis   CBC with Differential/Platelet   Lipid panel   Comprehensive metabolic panel      Meds ordered this encounter  Medications   tretinoin (RETIN-A) 0.025 % cream    Sig: Apply topically at bedtime.    Dispense:  45 g    Refill:  2      Follow-up: Return in about 1 year (around 08/01/2022) for a follow-up visit.  Sonda Primes, MD

## 2021-07-31 NOTE — Assessment & Plan Note (Signed)
  We discussed age appropriate health related issues, including available/recomended screening tests and vaccinations. Labs were ordered to be later reviewed . All questions were answered. We discussed one or more of the following - seat belt use, use of sunscreen/sun exposure exercise, fall risk reduction, second hand smoke exposure, firearm use and storage, seat belt use, a need for adhering to healthy diet and exercise. Labs were ordered.  All questions were answered. GYN care/IUD per Dr Henderson Cloud

## 2021-08-17 ENCOUNTER — Encounter: Payer: Self-pay | Admitting: Internal Medicine

## 2021-08-17 ENCOUNTER — Ambulatory Visit (INDEPENDENT_AMBULATORY_CARE_PROVIDER_SITE_OTHER): Payer: No Typology Code available for payment source | Admitting: Internal Medicine

## 2021-08-17 VITALS — BP 110/80 | HR 81 | Temp 98.3°F | Resp 16 | Ht 67.5 in

## 2021-08-17 DIAGNOSIS — U071 COVID-19: Secondary | ICD-10-CM | POA: Insufficient documentation

## 2021-08-17 DIAGNOSIS — J029 Acute pharyngitis, unspecified: Secondary | ICD-10-CM | POA: Diagnosis not present

## 2021-08-17 DIAGNOSIS — M549 Dorsalgia, unspecified: Secondary | ICD-10-CM | POA: Diagnosis not present

## 2021-08-17 LAB — POC COVID19 BINAXNOW: SARS Coronavirus 2 Ag: POSITIVE — AB

## 2021-08-17 MED ORDER — NIRMATRELVIR/RITONAVIR (PAXLOVID)TABLET: 3.0000 | ORAL_TABLET | Freq: Two times a day (BID) | ORAL | 0 refills | Status: AC

## 2021-08-17 NOTE — Progress Notes (Signed)
Subjective:  Patient ID: Norma White, female    DOB: 08/20/1985  Age: 36 y.o. MRN: [id number]  CC: URI and Back Pain   HPI Norma White presents for f/up - -  She complains of a three history of non radiating low back pain, np cough, and sore throat.   Outpatient Medications Prior to Visit  Medication Sig Dispense Refill   cephALEXin (KEFLEX) 500 MG capsule Take 1 capsule (500 mg total) by mouth 4 (four) times daily. 20 capsule 1   famotidine (PEPCID) 20 MG tablet Take 2 tablets (40 mg total) by mouth daily as needed for heartburn or indigestion. Take w/Ibuprofen 60 tablet 3   hydrocortisone (ANUSOL-HC) 25 MG suppository Place 1 suppository (25 mg total) rectally 2 (two) times daily. 20 suppository 1   hydroquinone 4 % cream Apply topically 2 (two) times daily. 28.35 g 1   ibuprofen (ADVIL) 800 MG tablet Take 1 tablet (800 mg total) by mouth 2 (two) times daily as needed for moderate pain. 60 tablet 3   levonorgestrel (MIRENA) 20 MCG/DAY IUD 1 each by Intrauterine route once.     metroNIDAZOLE (FLAGYL) 500 MG tablet Take 1 tablet (500 mg total) by mouth 3 (three) times daily. 30 tablet 1   saccharomyces boulardii (FLORASTOR) 250 MG capsule Take 1 capsule (250 mg total) by mouth 2 (two) times daily. Take after you finish Flagyl 60 capsule 1   tretinoin (RETIN-A) 0.025 % cream Apply topically at bedtime. 45 g 2   triamcinolone ointment (KENALOG) 0.5 % Apply 1 application topically 4 (four) times daily. 120 g 1   No facility-administered medications prior to visit.    ROS Review of Systems  Constitutional:  Negative for chills, diaphoresis, fatigue and fever.  HENT:  Positive for sore throat.   Eyes: Negative.   Respiratory:  Positive for cough and shortness of breath. Negative for chest tightness and wheezing.   Cardiovascular:  Negative for chest pain, palpitations and leg swelling.  Gastrointestinal:  Negative for abdominal pain, diarrhea, nausea and vomiting.  Endocrine:  Negative.   Genitourinary: Negative.   Musculoskeletal:  Positive for back pain. Negative for arthralgias and myalgias.  Skin: Negative.  Negative for rash.  Neurological: Negative.  Negative for dizziness.  Hematological:  Negative for adenopathy. Does not bruise/bleed easily.  Psychiatric/Behavioral: Negative.      Objective:  BP 110/80 (BP Location: Left Arm, Patient Position: Sitting, Cuff Size: Large)   Pulse 81   Temp 98.3 F (36.8 C) (Oral)   Ht 5' 7.5" (1.715 m)   SpO2 98%   BMI 34.87 kg/m   BP Readings from Last 3 Encounters:  08/17/21 110/80  07/31/21 126/84  08/08/20 122/82    Wt Readings from Last 3 Encounters:  07/31/21 226 lb (102.5 kg)  08/08/20 227 lb (103 kg)  07/25/20 223 lb (101.2 kg)    Physical Exam Vitals reviewed.  Constitutional:      Appearance: Normal appearance. She is not ill-appearing.  HENT:     Nose: Nose normal.     Mouth/Throat:     Mouth: Mucous membranes are moist.  Eyes:     General: No scleral icterus.    Conjunctiva/sclera: Conjunctivae normal.  Cardiovascular:     Rate and Rhythm: Normal rate and regular rhythm.     Heart sounds: No murmur heard. Pulmonary:     Effort: Pulmonary effort is normal.     Breath sounds: No stridor. No wheezing, rhonchi or rales.  Abdominal:  General: Abdomen is flat.     Palpations: There is no mass.     Tenderness: There is no abdominal tenderness. There is no guarding or rebound.  Musculoskeletal:        General: Normal range of motion.     Cervical back: Neck supple.     Right lower leg: No edema.     Left lower leg: No edema.  Lymphadenopathy:     Cervical: No cervical adenopathy.  Skin:    General: Skin is warm and dry.     Findings: No rash.  Neurological:     General: No focal deficit present.     Lab Results  Component Value Date   WBC 4.2 07/31/2021   HGB 13.3 07/31/2021   HCT 39.6 07/31/2021   PLT 271.0 07/31/2021   GLUCOSE 58 (L) 07/31/2021   CHOL 173 07/31/2021    TRIG 51.0 07/31/2021   HDL 49.80 07/31/2021   LDLCALC 113 (H) 07/31/2021   ALT 16 07/31/2021   AST 23 07/31/2021   NA 139 07/31/2021   K 3.7 07/31/2021   CL 105 07/31/2021   CREATININE 0.70 07/31/2021   BUN 11 07/31/2021   CO2 26 07/31/2021   TSH 0.62 07/31/2021   HGBA1C 5.5 07/25/2020    CT Abdomen Pelvis W Contrast  Result Date: 07/10/2020 CLINICAL DATA:  Watery stools.  Acute abdominal pain EXAM: CT ABDOMEN AND PELVIS WITH CONTRAST TECHNIQUE: Multidetector CT imaging of the abdomen and pelvis was performed using the standard protocol following bolus administration of intravenous contrast. CONTRAST:  OMNIPAQUE IOHEXOL 300 MG/ML  SOLN COMPARISON:  None. FINDINGS: Lower chest:  No contributory findings. Hepatobiliary: Subcentimeter low-density in the anterior right lobe liver which is too small for densitometry.No evidence of biliary obstruction or stone. Pancreas: Unremarkable. Spleen: Unremarkable. Adrenals/Urinary Tract: 17 mm low-density right adrenal nodule measuring -2 Hounsfield unit. No hydronephrosis or stone. Unremarkable bladder. Stomach/Bowel:  No obstruction. No appendicitis. Vascular/Lymphatic: No acute vascular abnormality. No mass or adenopathy. Reproductive:IUD in expected position. Other: No ascites or pneumoperitoneum. Musculoskeletal: No acute abnormalities. IMPRESSION: 1. No acute finding.  No visible bowel inflammation. 2. Right adrenal adenoma. Electronically Signed   By: Marnee Spring M.D.   On: 07/10/2020 05:42    Assessment & Plan:   Norma White was seen today for uri and back pain.  Diagnoses and all orders for this visit:  Sore throat- Covid positive, will treat with Paxlovid. -     POC COVID-19  Back pain, unspecified back location, unspecified back pain laterality, unspecified chronicity -     POC COVID-19  Acute COVID-19 -     nirmatrelvir/ritonavir EUA (PAXLOVID) 20 x 150 MG & 10 x 100MG  TABS; Take 3 tablets by mouth 2 (two) times daily for 5  days. (Take nirmatrelvir 150 mg two tablets twice daily for 5 days and ritonavir 100 mg one tablet twice daily for 5 days) Patient GFR is 75   I am having Norma White start on nirmatrelvir/ritonavir EUA. I am also having her maintain her levonorgestrel, triamcinolone ointment, hydrocortisone, hydroquinone, ibuprofen, famotidine, cephALEXin, metroNIDAZOLE, saccharomyces boulardii, and tretinoin.  Meds ordered this encounter  Medications   nirmatrelvir/ritonavir EUA (PAXLOVID) 20 x 150 MG & 10 x 100MG  TABS    Sig: Take 3 tablets by mouth 2 (two) times daily for 5 days. (Take nirmatrelvir 150 mg two tablets twice daily for 5 days and ritonavir 100 mg one tablet twice daily for 5 days) Patient GFR is 75    Dispense:  30 tablet    Refill:  0     Follow-up: No follow-ups on file.  Sanda Linger, MD

## 2021-08-18 ENCOUNTER — Ambulatory Visit: Payer: No Typology Code available for payment source | Admitting: Sports Medicine

## 2021-09-08 ENCOUNTER — Ambulatory Visit (INDEPENDENT_AMBULATORY_CARE_PROVIDER_SITE_OTHER): Payer: No Typology Code available for payment source | Admitting: Internal Medicine

## 2021-09-08 ENCOUNTER — Encounter: Payer: Self-pay | Admitting: Internal Medicine

## 2021-09-08 ENCOUNTER — Ambulatory Visit (INDEPENDENT_AMBULATORY_CARE_PROVIDER_SITE_OTHER): Payer: No Typology Code available for payment source

## 2021-09-08 VITALS — BP 122/80 | HR 76 | Temp 98.2°F | Wt 230.0 lb

## 2021-09-08 DIAGNOSIS — M546 Pain in thoracic spine: Secondary | ICD-10-CM | POA: Diagnosis not present

## 2021-09-08 DIAGNOSIS — M549 Dorsalgia, unspecified: Secondary | ICD-10-CM | POA: Diagnosis not present

## 2021-09-08 MED ORDER — TIZANIDINE HCL 2 MG PO TABS: 2.0000 mg | ORAL_TABLET | Freq: Three times a day (TID) | ORAL | 0 refills | Status: DC | PRN

## 2021-09-08 NOTE — Patient Instructions (Signed)
Managing Chronic Back Pain Chronic back pain is back pain that lasts for 12 weeks or longer. It often affects the lower back. Back pain may feel like a muscle ache or a sharp, stabbing pain. It can be mild, moderate, or severe. If you have been diagnosed with chronic back pain, there are things you can do to manage your symptoms. You may have to try different things to see what works best for you. Your health care provider may also give you specific instructions. How to manage lifestyle changes Treating chronic back pain often starts with rest and pain relief, followed by exercises to restore movement and strength to your back (physical therapy). You may need surgery if other treatments do not help, or if your pain is caused by a condition or an injury. Follow your treatment plan as told by your health care provider. This may include: Relaxation techniques. Talk therapy or counseling with a mental health specialist. A form of talk therapy called cognitive behavioral therapy (CBT) can be especially helpful. This therapy helps you set goals and follow up on the changes that you make. Acupuncture or massage therapy. Local electrical stimulation. Injections. These deliver numbing or pain-relieving medicines into your spine or the area of pain. How to recognize changes in your chronic back pain Your condition may improve with treatment. However, back pain may not go away or may get worse over time. Watch your symptoms carefully and let your health care provider know if your symptoms get worse or do not improve. Your back pain may be getting worse if you have: Pain that begins to cause problems with posture. Pain that gets worse when you are sitting, standing, walking, bending, or lifting. Pain that affects you while you are active, or at rest, or both. Pain that eventually makes it hard to move around (limits mobility). Pain that occurs with fever, weight loss, or difficulty urinating. Pain that causes  numbness and tingling. How to use body mechanics and posture to help with pain Healthy body mechanics and good posture can help to relieve stress on your back. Body mechanics refers to the movements and positions of your body during your daily activities. Posture is part of body mechanics. Good posture means: Your spine is in its natural S-curve, or neutral, position. Your shoulders are pulled back slightly. Your head is not tipped forward. Follow these guidelines to improve your posture and body mechanics in your everyday activities. Standing  When standing, keep your spine neutral and your feet about hip-width apart. Keep your knees slightly bent. Your ears, shoulders, and hips should line up. When you do a task in which you stand in one place for a long time, place one foot on a stable object that is 2-4 inches (5-10 cm) high, such as a footstool. This helps keep your spine neutral. Sitting  When sitting, keep your spine neutral and your feet flat on the floor. Use a footrest, if necessary, and keep your thighs parallel to the floor. Avoid rounding your shoulders, and avoid tilting your head forward. When working at a desk or a computer, keep your desk at a height where your hands are slightly lower than your elbows. Slide your chair under your desk so you are close enough to maintain good posture. When working at a computer, place your monitor at a height where you are looking straight ahead and you do not have to tilt your head forward or downward to view the screen. Lifting  Keep your feet at   least shoulder-width apart and tighten the muscles of your abdomen. Bend your knees and hips and keep your spine neutral. Be sure to lift using the strength of your legs, not your back. Do not lock your knees straight out. Always ask for help to lift heavy or awkward objects. Resting  When lying down and resting, avoid positions that are most painful. If you have pain with activities such as  sitting, bending, stooping, or squatting, lie in a position in which your body does not bend very much. For example, avoid curling up on your side with your arms and knees near your chest (fetal position). If you have pain with activities such as standing for a long time or reaching with your arms, lie with your spine in a neutral position and bend your knees slightly. Try: Lying on your side with a pillow between your knees. Lying on your back with a pillow under your knees. Follow these instructions at home: Medicines Treatment may include over-the-counter or prescription medicines for pain and inflammation that are taken by mouth or applied to the skin. Another treatment may include muscle relaxants. Take over-the-counter and prescription medicines only as told by your health care provider. Ask your health care provider if the medicine prescribed to you: Requires you to avoid driving or using machinery. Can cause constipation. You may need to take these actions to prevent or treat constipation: Drink enough fluid to keep your urine pale yellow. Take over-the-counter or prescription medicines. Eat foods that are high in fiber, such as beans, whole grains, and fresh fruits and vegetables. Limit foods that are high in fat and processed sugars, such as fried or sweet foods. Lifestyle Do not use any products that contain nicotine or tobacco, such as cigarettes, e-cigarettes, and chewing tobacco. If you need help quitting, ask your health care provider. Eat a healthy diet that includes foods such as vegetables, fruits, fish, and lean meats. Work with your health care provider to achieve or maintain a healthy weight. General instructions Get regular exercise as told. Exercise improves flexibility and strength. If physical therapy was prescribed, do exercises as told by your health care provider. Use ice or heat therapy as told by your health care provider. Keep all follow-up visits as told by your  health care provider. This is important. Where can I get support? Consider joining a support group for people managing chronic back pain. Ask your health care provider about support groups in your area. You can also find online and in-person support groups through: The American Chronic Pain Association: theacpa.org Pain Connection Program: painconnection.org Contact a health care provider if: You have pain that is not relieved with rest or medicine. Your pain gets worse, or you have new pain. You have a fever. You have rapid weight loss. You have trouble doing your normal activities. Get help right away if: You have weakness or numbness in one or both of your legs or feet. You have trouble controlling your bladder or your bowels. You have severe back pain and have any of the following: Nausea or vomiting. Abdominal pain. Shortness of breath or you faint. Summary Chronic back pain is often treated with rest, pain relief, and physical therapy. Talk therapy, acupuncture, massage, and local electrical stimulation may help. Follow your treatment plan as told by your health care provider. Joining a support group may help you manage chronic back pain. This information is not intended to replace advice given to you by your health care provider. Make sure you   discuss any questions you have with your health care provider. Document Revised: 01/29/2019 Document Reviewed: 10/07/2018 [employer] Patient Education  2023 [employer]  

## 2021-09-08 NOTE — Progress Notes (Unsigned)
Subjective:  Patient ID: Norma White, female    DOB: 1985-03-12  Age: 36 y.o. MRN: [id number]  CC: Back Pain   HPI Norma White presents for f/up -  She complains of a 1 day history of nontraumatic mid thoracic spine.  She says the pain radiates up and down but not laterally or towards her extremities.  She has gotten modest symptom relief with ibuprofen.  The pain is worsened with movement and she describes it as a pressure/pulling sensation.  Outpatient Medications Prior to Visit  Medication Sig Dispense Refill   cephALEXin (KEFLEX) 500 MG capsule Take 1 capsule (500 mg total) by mouth 4 (four) times daily. 20 capsule 1   famotidine (PEPCID) 20 MG tablet Take 2 tablets (40 mg total) by mouth daily as needed for heartburn or indigestion. Take w/Ibuprofen 60 tablet 3   hydrocortisone (ANUSOL-HC) 25 MG suppository Place 1 suppository (25 mg total) rectally 2 (two) times daily. 20 suppository 1   hydroquinone 4 % cream Apply topically 2 (two) times daily. 28.35 g 1   ibuprofen (ADVIL) 800 MG tablet Take 1 tablet (800 mg total) by mouth 2 (two) times daily as needed for moderate pain. 60 tablet 3   levonorgestrel (MIRENA) 20 MCG/DAY IUD 1 each by Intrauterine route once.     metroNIDAZOLE (FLAGYL) 500 MG tablet Take 1 tablet (500 mg total) by mouth 3 (three) times daily. 30 tablet 1   saccharomyces boulardii (FLORASTOR) 250 MG capsule Take 1 capsule (250 mg total) by mouth 2 (two) times daily. Take after you finish Flagyl 60 capsule 1   tretinoin (RETIN-A) 0.025 % cream Apply topically at bedtime. 45 g 2   triamcinolone ointment (KENALOG) 0.5 % Apply 1 application topically 4 (four) times daily. 120 g 1   No facility-administered medications prior to visit.    ROS Review of Systems  Constitutional: Negative.  Negative for diaphoresis and fatigue.  HENT: Negative.    Eyes: Negative.   Respiratory:  Negative for cough, chest tightness, shortness of breath and wheezing.    Cardiovascular:  Negative for chest pain, palpitations and leg swelling.  Gastrointestinal:  Negative for abdominal pain, constipation, diarrhea, nausea and vomiting.  Endocrine: Negative.   Genitourinary: Negative.   Musculoskeletal:  Positive for back pain. Negative for arthralgias, joint swelling and myalgias.  Skin: Negative.   Allergic/Immunologic: Negative.   Neurological: Negative.  Negative for dizziness, weakness and numbness.  Hematological: Negative.   Psychiatric/Behavioral: Negative.      Objective:  BP 122/80 (BP Location: Left Arm, Patient Position: Sitting, Cuff Size: Normal)   Pulse 76   Temp 98.2 F (36.8 C) (Oral)   Wt 230 lb (104.3 kg)   LMP  (LMP Unknown)   SpO2 99%   BMI 35.49 kg/m   BP Readings from Last 3 Encounters:  09/08/21 122/80  08/17/21 110/80  07/31/21 126/84    Wt Readings from Last 3 Encounters:  09/08/21 230 lb (104.3 kg)  07/31/21 226 lb (102.5 kg)  08/08/20 227 lb (103 kg)    Physical Exam Vitals reviewed.  HENT:     Mouth/Throat:     Mouth: Mucous membranes are moist.  Eyes:     General: No scleral icterus.    Conjunctiva/sclera: Conjunctivae normal.  Cardiovascular:     Rate and Rhythm: Normal rate and regular rhythm.     Heart sounds: No murmur heard. Pulmonary:     Effort: Pulmonary effort is normal.     Breath sounds: No stridor.  No wheezing, rhonchi or rales.  Abdominal:     General: Abdomen is flat.     Palpations: There is no mass.     Tenderness: There is no abdominal tenderness. There is no guarding.     Hernia: No hernia is present.  Musculoskeletal:        General: Normal range of motion.     Cervical back: Normal and neck supple. No bony tenderness. No pain with movement. Normal range of motion.     Thoracic back: Normal. No swelling, edema, deformity, signs of trauma, spasms, tenderness or bony tenderness. Normal range of motion.     Lumbar back: Normal.     Right lower leg: No edema.     Left lower leg:  No edema.  Lymphadenopathy:     Cervical: No cervical adenopathy.  Skin:    General: Skin is warm and dry.  Neurological:     General: No focal deficit present.     Mental Status: She is alert. Mental status is at baseline.     Motor: No weakness.  Psychiatric:        Mood and Affect: Mood normal.        Behavior: Behavior normal.     Lab Results  Component Value Date   WBC 4.2 07/31/2021   HGB 13.3 07/31/2021   HCT 39.6 07/31/2021   PLT 271.0 07/31/2021   GLUCOSE 58 (L) 07/31/2021   CHOL 173 07/31/2021   TRIG 51.0 07/31/2021   HDL 49.80 07/31/2021   LDLCALC 113 (H) 07/31/2021   ALT 16 07/31/2021   AST 23 07/31/2021   NA 139 07/31/2021   K 3.7 07/31/2021   CL 105 07/31/2021   CREATININE 0.70 07/31/2021   BUN 11 07/31/2021   CO2 26 07/31/2021   TSH 0.62 07/31/2021   HGBA1C 5.5 07/25/2020    CT Abdomen Pelvis W Contrast  Result Date: 07/10/2020 CLINICAL DATA:  Watery stools.  Acute abdominal pain EXAM: CT ABDOMEN AND PELVIS WITH CONTRAST TECHNIQUE: Multidetector CT imaging of the abdomen and pelvis was performed using the standard protocol following bolus administration of intravenous contrast. CONTRAST:  140mL OMNIPAQUE IOHEXOL 300 MG/ML  SOLN COMPARISON:  None. FINDINGS: Lower chest:  No contributory findings. Hepatobiliary: Subcentimeter low-density in the anterior right lobe liver which is too small for densitometry.No evidence of biliary obstruction or stone. Pancreas: Unremarkable. Spleen: Unremarkable. Adrenals/Urinary Tract: 17 mm low-density right adrenal nodule measuring -2 Hounsfield unit. No hydronephrosis or stone. Unremarkable bladder. Stomach/Bowel:  No obstruction. No appendicitis. Vascular/Lymphatic: No acute vascular abnormality. No mass or adenopathy. Reproductive:IUD in expected position. Other: No ascites or pneumoperitoneum. Musculoskeletal: No acute abnormalities. IMPRESSION: 1. No acute finding.  No visible bowel inflammation. 2. Right adrenal adenoma.  Electronically Signed   By: Monte Fantasia M.D.   On: 07/10/2020 05:42    No results found.   Assessment & Plan:   Norma White was seen today for back pain.  Diagnoses and all orders for this visit:  Pain in thoracic spine- Exam and plain films are reassuring.  Will treat for musculoskeletal pain. -     DG Thoracic Spine W/Swimmers; Future -     tiZANidine (ZANAFLEX) 2 MG tablet; Take 1 tablet (2 mg total) by mouth every 8 (eight) hours as needed for muscle spasms.  Musculoskeletal back pain -     tiZANidine (ZANAFLEX) 2 MG tablet; Take 1 tablet (2 mg total) by mouth every 8 (eight) hours as needed for muscle spasms.   I am  having Norma White start on tiZANidine. I am also having her maintain her levonorgestrel, triamcinolone ointment, hydrocortisone, hydroquinone, ibuprofen, famotidine, cephALEXin, metroNIDAZOLE, saccharomyces boulardii, and tretinoin.  Meds ordered this encounter  Medications   tiZANidine (ZANAFLEX) 2 MG tablet    Sig: Take 1 tablet (2 mg total) by mouth every 8 (eight) hours as needed for muscle spasms.    Dispense:  30 tablet    Refill:  0     Follow-up: Return if symptoms worsen or fail to improve.  Sanda Linger, MD

## 2021-10-06 ENCOUNTER — Other Ambulatory Visit: Payer: Self-pay | Admitting: Internal Medicine

## 2021-10-11 ENCOUNTER — Telehealth: Payer: Self-pay | Admitting: *Deleted

## 2021-10-11 ENCOUNTER — Other Ambulatory Visit (HOSPITAL_COMMUNITY): Payer: Self-pay

## 2021-10-11 MED ORDER — FAMOTIDINE 20 MG PO TABS
40.0000 mg | ORAL_TABLET | Freq: Every day | ORAL | 3 refills | Status: DC | PRN
  Filled 2021-10-11: qty 60, 30d supply, fill #0

## 2021-10-11 NOTE — Telephone Encounter (Signed)
Notified pt rx has been sent.../lmb 

## 2021-10-11 NOTE — Telephone Encounter (Signed)
Morning- Can you send the famotidine to the [facility]? I went to pick them up from [facility] and they were higher than normal. The pharmacist said that the [facility] preference for [facility] med now is [facility]. Smh (sorry to sound like a typical pt lol)

## 2021-10-12 ENCOUNTER — Other Ambulatory Visit: Payer: Self-pay | Admitting: *Deleted

## 2021-10-12 ENCOUNTER — Other Ambulatory Visit (HOSPITAL_COMMUNITY): Payer: Self-pay

## 2021-10-12 MED ORDER — IBUPROFEN 800 MG PO TABS
800.0000 mg | ORAL_TABLET | Freq: Two times a day (BID) | ORAL | 3 refills | Status: DC | PRN
  Filled 2021-10-12: qty 60, 30d supply, fill #0

## 2021-10-12 MED ORDER — TRIAMCINOLONE ACETONIDE 0.5 % EX OINT
1.0000 | TOPICAL_OINTMENT | Freq: Four times a day (QID) | CUTANEOUS | 1 refills | Status: DC
  Filled 2021-10-12 – 2022-02-14 (×4): qty 120, 30d supply, fill #0
  Filled 2022-09-22: qty 120, 30d supply, fill #1

## 2021-10-12 MED ORDER — TRETINOIN 0.025 % EX CREA
TOPICAL_CREAM | Freq: Every day | CUTANEOUS | 2 refills | Status: DC
  Filled 2021-10-12: qty 45, 30d supply, fill #0

## 2021-10-12 MED ORDER — HYDROQUINONE 4 % EX CREA
TOPICAL_CREAM | Freq: Two times a day (BID) | CUTANEOUS | 1 refills | Status: DC
  Filled 2021-10-12: qty 28.35, 30d supply, fill #0
  Filled 2021-10-21: qty 28.4, 10d supply, fill #0
  Filled 2022-09-22: qty 28.4, 30d supply, fill #0

## 2021-10-12 NOTE — Telephone Encounter (Signed)
me again...can you send my other Rx's to [facility] also. Smh. Pt is up-to-date sent refills to [facility].Marland KitchenJohny Chess

## 2021-10-16 ENCOUNTER — Other Ambulatory Visit (HOSPITAL_COMMUNITY): Payer: Self-pay

## 2021-10-20 ENCOUNTER — Telehealth: Payer: Self-pay | Admitting: Internal Medicine

## 2021-10-20 MED ORDER — CEPHALEXIN 500 MG PO CAPS: 500.0000 mg | ORAL_CAPSULE | Freq: Four times a day (QID) | ORAL | 1 refills | Status: DC

## 2021-10-20 NOTE — Telephone Encounter (Signed)
Morning Dr. Camila Li - Im having some symptoms of a UTI again. Can you send me an [facility]lex to [facility] please?  OK

## 2021-10-21 ENCOUNTER — Other Ambulatory Visit (HOSPITAL_COMMUNITY): Payer: Self-pay

## 2021-10-24 ENCOUNTER — Other Ambulatory Visit (HOSPITAL_COMMUNITY): Payer: Self-pay

## 2021-10-25 ENCOUNTER — Other Ambulatory Visit (HOSPITAL_COMMUNITY): Payer: Self-pay

## 2021-10-25 ENCOUNTER — Encounter (HOSPITAL_COMMUNITY): Payer: Self-pay | Admitting: Pharmacist

## 2021-10-30 ENCOUNTER — Other Ambulatory Visit (HOSPITAL_COMMUNITY): Payer: Self-pay

## 2021-10-31 ENCOUNTER — Other Ambulatory Visit (HOSPITAL_COMMUNITY): Payer: Self-pay

## 2021-11-02 ENCOUNTER — Other Ambulatory Visit (HOSPITAL_COMMUNITY): Payer: Self-pay

## 2021-11-02 ENCOUNTER — Encounter (HOSPITAL_COMMUNITY): Payer: Self-pay

## 2021-11-10 ENCOUNTER — Other Ambulatory Visit (HOSPITAL_COMMUNITY): Payer: Self-pay

## 2021-11-10 ENCOUNTER — Encounter (HOSPITAL_COMMUNITY): Payer: Self-pay

## 2021-11-14 ENCOUNTER — Other Ambulatory Visit: Payer: Self-pay | Admitting: Internal Medicine

## 2021-11-14 MED ORDER — METRONIDAZOLE 500 MG PO TABS: 500.0000 mg | ORAL_TABLET | Freq: Three times a day (TID) | ORAL | 1 refills | Status: DC

## 2021-11-14 NOTE — Progress Notes (Signed)
Flagyl for gas

## 2021-11-17 ENCOUNTER — Other Ambulatory Visit (HOSPITAL_COMMUNITY): Payer: Self-pay

## 2021-11-24 ENCOUNTER — Other Ambulatory Visit (HOSPITAL_COMMUNITY): Payer: Self-pay

## 2021-12-14 ENCOUNTER — Ambulatory Visit (INDEPENDENT_AMBULATORY_CARE_PROVIDER_SITE_OTHER): Payer: No Typology Code available for payment source | Admitting: Family Medicine

## 2021-12-14 ENCOUNTER — Ambulatory Visit (INDEPENDENT_AMBULATORY_CARE_PROVIDER_SITE_OTHER): Payer: No Typology Code available for payment source

## 2021-12-14 ENCOUNTER — Ambulatory Visit: Payer: Self-pay

## 2021-12-14 ENCOUNTER — Telehealth: Payer: Self-pay | Admitting: Internal Medicine

## 2021-12-14 VITALS — BP 122/80 | Ht 67.5 in | Wt 232.0 lb

## 2021-12-14 DIAGNOSIS — M79672 Pain in left foot: Secondary | ICD-10-CM | POA: Diagnosis not present

## 2021-12-14 MED ORDER — TOBRAMYCIN 0.3 % OP SOLN: OPHTHALMIC | 1 refills | Status: DC

## 2021-12-14 MED ORDER — PREDNISONE 50 MG PO TABS: 50.0000 mg | ORAL_TABLET | Freq: Every day | ORAL | 0 refills | Status: DC

## 2021-12-14 NOTE — Telephone Encounter (Signed)
Green nail discolration - Rx sent for tobrex ophth sol as dirrected

## 2021-12-14 NOTE — Patient Instructions (Signed)
Thank you for coming in today.   Please get an Xray today before you leave   Use a post op shoe or cam walker boot for a few weeks.   Take ibuprofen and use voltaren gel on the foot.  If not better we can move to an injection at any time.   Keep me updated.   Marland Kitchen

## 2021-12-14 NOTE — Progress Notes (Signed)
I, [name], LAT, ATC acting as a scribe for Norma Graham, MD.  Subjective:    CC: Left foot pain  HPI: Patient is a 36 year old female presenting with left foot pain.  Patient was previously seen by Dr. [name] on 06/16/2019 for a slipped rib.  Today, patient notes pain located at the dorsal aspect of the left foot.  This is associated with a small nodule that is tender to palpation.  She cannot recall any activity or injury that may have caused this.  She denies any increasing levels of activity such as running or walking program.  She denies any injury.  She feels well otherwise.  No known history of gout.   Pertinent review of Systems: No fevers or chills  Relevant historical information: History of back pain   Objective:    Wt Readings from Last 1 Encounters:  12/14/21 232 lb (105.2 kg)    General: Well Developed, well nourished, and in no acute distress.   MSK: Left foot: Normal-appearing no significant swelling. Tender palpation dorsal midfoot at the first tarsometatarsal joint.  There is a small nodule palpated in this region. Normal foot and ankle motion.  Some pain is present with resisted foot dorsiflexion. Pulses cap refill and sensation are intact distally.  Lab and Radiology Results  Diagnostic Limited MSK Ultrasound of: Left midfoot dorsal Tiny hypoechoic nodule associated with the left first ray tarsal tarsal joint near the Lisfranc area is visible.  It measures 4 mm in diameter without vascular activity within the nodule.  However it is next to the dorsal pedis artery. The nodule appears to be arising from a joint structure and is consistent with a ganglion cyst. The nodule is tender to palpation with the ultrasound probe. Impression: Tiny dorsal midfoot ganglion cyst  X-ray images left foot obtained today personally and independently interpreted No acute fractures.  No severe degenerative changes. Await formal radiology review  Impression and  Recommendations:    Assessment and Plan: 36 y.o. female with acute left midfoot pain.  Etiology is unclear.  The most likely explanation is she has a painful ganglion cyst associated with the dorsal midfoot.  She is having more pain than I would expect, but it is in a somewhat awkward location it could be causing enough pain. We discussed options.  Plan for trial of conservative management with immobilization with postop shoe and cam walker boot and oral and topical NSAIDs followed by a prednisone if needed.  If this does not work we can move to a trial of injection.  I would like to avoid injecting this if possible given its proximity to an artery and nerve.  However injection is technically possible and safe with ultrasound guidance.  PDMP not reviewed this encounter. Orders Placed This Encounter  Procedures   [place] LIMITED JOINT SPACE STRUCTURES LOW LEFT(NO LINKED CHARGES)    Order Specific Question:   Reason for Exam (SYMPTOM  OR DIAGNOSIS REQUIRED)    Answer:   left foot pain    Order Specific Question:   Preferred imaging location?    Answer:   [facility] Foot Complete Left    Standing Status:   Future    Number of Occurrences:   1    Standing Expiration Date:   12/15/2022    Order Specific Question:   Reason for Exam (SYMPTOM  OR DIAGNOSIS REQUIRED)    Answer:   eval foot pain left    Order Specific Question:  Is patient pregnant?    Answer:   No    Order Specific Question:   Preferred imaging location?    Answer:   [facility]   Meds ordered this encounter  Medications   predniSONE (DELTASONE) 50 MG tablet    Sig: Take 1 tablet (50 mg total) by mouth daily.    Dispense:  5 tablet    Refill:  0    Discussed warning signs or symptoms. Please see discharge instructions. Patient expresses understanding.   The above documentation has been reviewed and is accurate and complete [name], M.D.

## 2021-12-22 NOTE — Progress Notes (Signed)
Left foot x-ray looks normal to radiology.

## 2021-12-23 ENCOUNTER — Other Ambulatory Visit: Payer: Self-pay | Admitting: Internal Medicine

## 2021-12-23 DIAGNOSIS — B351 Tinea unguium: Secondary | ICD-10-CM | POA: Insufficient documentation

## 2021-12-23 MED ORDER — TERBINAFINE HCL 250 MG PO TABS: 250.0000 mg | ORAL_TABLET | Freq: Every day | ORAL | 0 refills | Status: DC

## 2022-01-02 DIAGNOSIS — B351 Tinea unguium: Secondary | ICD-10-CM | POA: Diagnosis not present

## 2022-01-02 DIAGNOSIS — L609 Nail disorder, unspecified: Secondary | ICD-10-CM | POA: Diagnosis not present

## 2022-01-09 ENCOUNTER — Other Ambulatory Visit: Payer: Self-pay | Admitting: Internal Medicine

## 2022-01-09 ENCOUNTER — Encounter: Payer: Self-pay | Admitting: Internal Medicine

## 2022-01-09 ENCOUNTER — Other Ambulatory Visit (INDEPENDENT_AMBULATORY_CARE_PROVIDER_SITE_OTHER): Payer: 59

## 2022-01-09 DIAGNOSIS — B351 Tinea unguium: Secondary | ICD-10-CM

## 2022-01-09 LAB — HEPATIC FUNCTION PANEL
ALT: 16 U/L (ref 0–35)
AST: 20 U/L (ref 0–37)
Albumin: 4.1 g/dL (ref 3.5–5.2)
Alkaline Phosphatase: 58 U/L (ref 39–117)
Bilirubin, Direct: 0.1 mg/dL (ref 0.0–0.3)
Total Bilirubin: 0.3 mg/dL (ref 0.2–1.2)
Total Protein: 7.7 g/dL (ref 6.0–8.3)

## 2022-01-11 ENCOUNTER — Other Ambulatory Visit: Payer: Self-pay

## 2022-01-11 ENCOUNTER — Other Ambulatory Visit (HOSPITAL_COMMUNITY): Payer: Self-pay

## 2022-01-12 ENCOUNTER — Other Ambulatory Visit (HOSPITAL_COMMUNITY): Payer: Self-pay

## 2022-01-12 ENCOUNTER — Encounter (HOSPITAL_COMMUNITY): Payer: Self-pay

## 2022-01-17 ENCOUNTER — Other Ambulatory Visit: Payer: Self-pay

## 2022-01-31 ENCOUNTER — Other Ambulatory Visit: Payer: Self-pay | Admitting: Internal Medicine

## 2022-01-31 DIAGNOSIS — B351 Tinea unguium: Secondary | ICD-10-CM

## 2022-01-31 MED ORDER — TERBINAFINE HCL 250 MG PO TABS: 250.0000 mg | ORAL_TABLET | Freq: Every day | ORAL | 0 refills | Status: AC

## 2022-02-14 ENCOUNTER — Other Ambulatory Visit (HOSPITAL_COMMUNITY): Payer: Self-pay

## 2022-02-14 ENCOUNTER — Other Ambulatory Visit: Payer: Self-pay

## 2022-02-21 ENCOUNTER — Ambulatory Visit (INDEPENDENT_AMBULATORY_CARE_PROVIDER_SITE_OTHER): Payer: Commercial Managed Care - PPO | Admitting: Internal Medicine

## 2022-02-21 ENCOUNTER — Encounter: Payer: Self-pay | Admitting: Internal Medicine

## 2022-02-21 VITALS — BP 120/78 | HR 81 | Temp 98.0°F | Ht 67.0 in

## 2022-02-21 DIAGNOSIS — J0100 Acute maxillary sinusitis, unspecified: Secondary | ICD-10-CM

## 2022-02-21 DIAGNOSIS — J019 Acute sinusitis, unspecified: Secondary | ICD-10-CM | POA: Insufficient documentation

## 2022-02-21 MED ORDER — CEFDINIR 300 MG PO CAPS: 300.0000 mg | ORAL_CAPSULE | Freq: Two times a day (BID) | ORAL | 0 refills | Status: DC

## 2022-02-21 NOTE — Assessment & Plan Note (Signed)
Omnicef po 

## 2022-02-21 NOTE — Progress Notes (Signed)
Subjective:  Patient ID: Norma White, female    DOB: 1985-10-24  Age: 37 y.o. MRN: [id number]  CC: No chief complaint on file.   HPI Norma White presents for sinusitis symptoms over 3 weeks duration, getting worse.  Complaining of green nasal discharge, sinus pain, congestion, stopped up ears  Outpatient Medications Prior to Visit  Medication Sig Dispense Refill   famotidine (PEPCID) 20 MG tablet Take 2 tablets (40 mg total) by mouth daily as needed for heartburn or indigestion. Take with Ibuprofen 60 tablet 3   hydroquinone 4 % cream Apply topically 2 (two) times daily. 28.4 g 1   ibuprofen (ADVIL) 800 MG tablet Take 1 tablet (800 mg total) by mouth 2 (two) times daily as needed for moderate pain. 60 tablet 3   levonorgestrel (MIRENA) 20 MCG/DAY IUD 1 each by Intrauterine route once.     metroNIDAZOLE (FLAGYL) 500 MG tablet Take 1 tablet (500 mg total) by mouth 3 (three) times daily. 30 tablet 1   NAFTIN 2 % GEL Apply topically daily.     terbinafine (LAMISIL) 250 MG tablet Take 1 tablet (250 mg total) by mouth daily. 60 tablet 0   triamcinolone ointment (KENALOG) 0.5 % Apply 1 Application topically 4 (four) times daily. 120 g 1   No facility-administered medications prior to visit.    ROS: Review of Systems  HENT:  Positive for congestion, rhinorrhea and sinus pressure.   Respiratory:  Negative for cough.     Objective:  BP 120/78 (BP Location: Left Arm, Patient Position: Sitting, Cuff Size: Normal)   Pulse 81   Temp 98 F (36.7 C) (Oral)   Ht 5' 7"$  (1.702 m)   SpO2 99%   BMI 36.34 kg/m   BP Readings from Last 3 Encounters:  02/21/22 120/78  12/14/21 122/80  09/08/21 122/80    Wt Readings from Last 3 Encounters:  12/14/21 232 lb (105.2 kg)  09/08/21 230 lb (104.3 kg)  07/31/21 226 lb (102.5 kg)    Physical Exam Constitutional:      General: She is not in acute distress.    Appearance: She is well-developed.  HENT:     Head: Normocephalic.     Right  Ear: External ear normal. There is no impacted cerumen.     Left Ear: External ear normal. There is no impacted cerumen.     Nose: Congestion present.     Mouth/Throat:     Mouth: Mucous membranes are dry.  Eyes:     General:        Right eye: No discharge.        Left eye: No discharge.     Conjunctiva/sclera: Conjunctivae normal.     Pupils: Pupils are equal, round, and reactive to light.  Neck:     Thyroid: No thyromegaly.     Vascular: No JVD.     Trachea: No tracheal deviation.  Cardiovascular:     Rate and Rhythm: Normal rate and regular rhythm.     Heart sounds: Normal heart sounds.  Pulmonary:     Effort: No respiratory distress.     Breath sounds: No stridor. No wheezing.  Abdominal:     General: Bowel sounds are normal. There is no distension.     Palpations: Abdomen is soft. There is no mass.     Tenderness: There is no abdominal tenderness. There is no guarding or rebound.  Musculoskeletal:        General: No tenderness.     Cervical  back: Normal range of motion and neck supple. No rigidity.  Lymphadenopathy:     Cervical: No cervical adenopathy.  Skin:    Findings: No erythema or rash.  Neurological:     Cranial Nerves: No cranial nerve deficit.     Motor: No abnormal muscle tone.     Coordination: Coordination normal.     Deep Tendon Reflexes: Reflexes normal.  Psychiatric:        Behavior: Behavior normal.        Thought Content: Thought content normal.        Judgment: Judgment normal.   Swollen, irritated nasal mucosa.  Irritated skin on the nares.  Lab Results  Component Value Date   WBC 4.2 07/31/2021   HGB 13.3 07/31/2021   HCT 39.6 07/31/2021   PLT 271.0 07/31/2021   GLUCOSE 58 (L) 07/31/2021   CHOL 173 07/31/2021   TRIG 51.0 07/31/2021   HDL 49.80 07/31/2021   LDLCALC 113 (H) 07/31/2021   ALT 16 01/09/2022   AST 20 01/09/2022   NA 139 07/31/2021   K 3.7 07/31/2021   CL 105 07/31/2021   CREATININE 0.70 07/31/2021   BUN 11 07/31/2021    CO2 26 07/31/2021   TSH 0.62 07/31/2021   HGBA1C 5.5 07/25/2020    CT Abdomen Pelvis W Contrast  Result Date: 07/10/2020 CLINICAL DATA:  Watery stools.  Acute abdominal pain EXAM: CT ABDOMEN AND PELVIS WITH CONTRAST TECHNIQUE: Multidetector CT imaging of the abdomen and pelvis was performed using the standard protocol following bolus administration of intravenous contrast. CONTRAST:  147m OMNIPAQUE IOHEXOL 300 MG/ML  SOLN COMPARISON:  None. FINDINGS: Lower chest:  No contributory findings. Hepatobiliary: Subcentimeter low-density in the anterior right lobe liver which is too small for densitometry.No evidence of biliary obstruction or stone. Pancreas: Unremarkable. Spleen: Unremarkable. Adrenals/Urinary Tract: 17 mm low-density right adrenal nodule measuring -2 Hounsfield unit. No hydronephrosis or stone. Unremarkable bladder. Stomach/Bowel:  No obstruction. No appendicitis. Vascular/Lymphatic: No acute vascular abnormality. No mass or adenopathy. Reproductive:IUD in expected position. Other: No ascites or pneumoperitoneum. Musculoskeletal: No acute abnormalities. IMPRESSION: 1. No acute finding.  No visible bowel inflammation. 2. Right adrenal adenoma. Electronically Signed   By: JMonte FantasiaM.D.   On: 07/10/2020 05:42    Assessment & Plan:   Problem List Items Addressed This Visit       Respiratory   Acute sinusitis - Primary    Omnicef po      Relevant Medications   cefdinir (OMNICEF) 300 MG capsule      Meds ordered this encounter  Medications   cefdinir (OMNICEF) 300 MG capsule    Sig: Take 1 capsule (300 mg total) by mouth 2 (two) times daily.    Dispense:  20 capsule    Refill:  0      Follow-up: No follow-ups on file.  AWalker Kehr MD

## 2022-03-05 ENCOUNTER — Other Ambulatory Visit: Payer: Self-pay | Admitting: Internal Medicine

## 2022-03-05 MED ORDER — CEFDINIR 300 MG PO CAPS: 300.0000 mg | ORAL_CAPSULE | Freq: Two times a day (BID) | ORAL | 0 refills | Status: DC

## 2022-03-06 ENCOUNTER — Ambulatory Visit (INDEPENDENT_AMBULATORY_CARE_PROVIDER_SITE_OTHER): Payer: Commercial Managed Care - PPO | Admitting: Internal Medicine

## 2022-03-06 ENCOUNTER — Encounter: Payer: Self-pay | Admitting: Internal Medicine

## 2022-03-06 VITALS — BP 120/82 | HR 85 | Temp 98.1°F | Resp 16 | Ht 67.0 in

## 2022-03-06 DIAGNOSIS — U071 COVID-19: Secondary | ICD-10-CM | POA: Diagnosis not present

## 2022-03-06 LAB — POC COVID19 BINAXNOW: SARS Coronavirus 2 Ag: POSITIVE — AB

## 2022-03-06 MED ORDER — NIRMATRELVIR/RITONAVIR (PAXLOVID)TABLET: 3.0000 | ORAL_TABLET | Freq: Two times a day (BID) | ORAL | 0 refills | Status: AC

## 2022-03-06 NOTE — Patient Instructions (Addendum)
Use an alternative method of contraception for the next month   COVID-19 COVID-19 is an infection caused by a virus called SARS-CoV-2. Most people who get COVID-19 have mild to moderate symptoms. Some have little to no symptoms. In others, the virus may cause a severe infection. What are the causes? COVID-19 is caused by a coronavirus. The virus may be in the air as droplets or as tiny specks of fluid (aerosols). It may also be on surfaces. You may catch the virus if you: Breathe in droplets when a person with COVID-19 breathes, speaks, sings, coughs, or sneezes. Touch something that has the virus on it and then touch your mouth, nose, or eyes. What increases the risk? Risk for infection: You are more likely to get COVID-19 if: You are within 6 ft (1.8 m) of a person who has COVID-19 for 15 minutes or longer. You provide care to a person who has COVID-19. You are in close contact with others. This includes hugging, kissing, or sharing utensils. Risk for serious illness caused by COVID-19: You are more likely to get very ill from COVID-19 if: You have cancer. You have a long-term (chronic) disease. This may be: A chronic lung disease, such as pulmonary embolism, chronic obstructive pulmonary disease (COPD), or cystic fibrosis. A disease that affects your body's defense system (immune system). If you have a weak immune system, you are said to be immunocompromised. A serious heart condition, such as heart failure, coronary artery disease, or cardiomyopathy. Diabetes. Chronic kidney disease. A liver disease, such as cirrhosis, nonalcoholic fatty liver disease, alcoholic liver disease, or autoimmune hepatitis. You are obese. You are pregnant or were just pregnant. You have sickle cell disease. What are the signs or symptoms? Symptoms of COVID-19 can range from mild to severe. They may appear any time from 2 to 14 days after you are exposed. They include: Fever or chills. Shortness of breath  or trouble breathing. Feeling tired. Headaches, body aches, or muscle aches. A runny or stuffy nose. Sneezing, coughing, or a sore throat. New loss of taste or smell. You may also have stomach problems, such as nausea, vomiting, or diarrhea. In some cases, you may not have any symptoms. How is this diagnosed? COVID-19 may be diagnosed by testing a sample to check for the virus. The most common tests are the PCR test and the antigen test. Tests may be done in the lab or at home. They include: Using a swab to take a sample of fluid from your nose. Testing a sample of saliva from your mouth. Testing a sample of mucus from your lungs (sputum). How is this treated? Treatment for COVID-19 depends on how severe your condition is. Mild symptoms can be treated at home. You should rest, drink fluids, and take over-the-counter medicine. If you have symptoms and risk factors, you may be prescribed a medicine that fights viruses (antiviral). Severe symptoms may be treated in a hospital intensive care unit (ICU). Treatment may include: Extra oxygen given through a tube in the nose, a face mask, or a hood. Medicines. These may include: Antivirals, such as remdesivir. Anti-inflammatories, such as corticosteroids. These help reduce inflammation. Antithrombotics. These help prevent or treat blood clots. Convalescent plasma. This helps boost your immune system. Prone positioning. This is when you are laid on your stomach to help oxygen get into your lungs. Infection control measures. If you are at risk for a more serious illness, your health care provider may prescribe two medicines to help your immune system  protect you. These are called long-acting monoclonal antibodies. They are given together every 6 months. How is this prevented? To protect yourself: Get the vaccine or vaccine series if you meet the guidelines. You can even get the vaccine while you are pregnant or making breast milk (lactating). Get  an added dose of the vaccine if you are immunocompromised. This applies if you have had an organ transplant or if you have a condition that affects your immune system. You should get the added dose 4 weeks after you got the first one. If you get an mRNA vaccine, you will need to get 3 doses. Talk to your provider about getting experimental monoclonal antibodies. This treatment can help prevent severe illness. It may be given to you if: You are immunocompromised. You cannot get the vaccine. You may not get the vaccine if you have a severe allergic reaction to it or to what it is made of. You are not fully vaccinated. You are in a place where there is COVID-19 and: You are in close contact with someone who has COVID-19. You are at high risk of being exposed. You are at risk of illness from new variants of the virus. To protect others: If you have symptoms of COVID-19, take steps to stop the virus from spreading. Stay home. Leave your house only to get medical care. Do not use public transit. Do not travel while you are sick. Wash your hands often with soap and water for at least 20 seconds. If soap and water are not available, use alcohol-based hand sanitizer. Make sure that all people in your household wash their hands well and often. Cough or sneeze into a tissue or your sleeve or elbow. Do not cough or sneeze into your hand or into the air. Where to find more information Centers for Disease Control and Prevention (CDC): [employer].cy World Health Organization [facility]): http://curry.org/ Get help right away if: You have trouble breathing. You have pain or pressure in your chest. You are confused. Your lips or fingernails turn blue. You have trouble waking from sleep. Your symptoms get worse. These symptoms may be an emergency. Get help right away. Call 911. Do not wait to see if the symptoms will go away. Do not drive yourself to the hospital. This information is not intended to replace advice given to  you by your health care provider. Make sure you discuss any questions you have with your health care provider. Document Revised: 09/01/2021 Document Reviewed: 09/01/2021 Elsevier Patient Education  [place].

## 2022-03-06 NOTE — Progress Notes (Signed)
Subjective:  Patient ID: Norma White, female    DOB: [birth year]  Age: 37 y.o. MRN: [id number]  CC: URI   HPI Norma White presents for f/up ----  She complains of a 3-day history of runny nose, nonproductive cough, and night sweats.  Outpatient Medications Prior to Visit  Medication Sig Dispense Refill   cefdinir (OMNICEF) 300 MG capsule Take 1 capsule (300 mg total) by mouth 2 (two) times daily. 20 capsule 0   famotidine (PEPCID) 20 MG tablet Take 2 tablets (40 mg total) by mouth daily as needed for heartburn or indigestion. Take with Ibuprofen 60 tablet 3   hydroquinone 4 % cream Apply topically 2 (two) times daily. 28.4 g 1   ibuprofen (ADVIL) 800 MG tablet Take 1 tablet (800 mg total) by mouth 2 (two) times daily as needed for moderate pain. 60 tablet 3   levonorgestrel (MIRENA) 20 MCG/DAY IUD 1 each by Intrauterine route once.     metroNIDAZOLE (FLAGYL) 500 MG tablet Take 1 tablet (500 mg total) by mouth 3 (three) times daily. 30 tablet 1   NAFTIN 2 % GEL Apply topically daily.     terbinafine (LAMISIL) 250 MG tablet Take 1 tablet (250 mg total) by mouth daily. 60 tablet 0   triamcinolone ointment (KENALOG) 0.5 % Apply 1 Application topically 4 (four) times daily. 120 g 1   No facility-administered medications prior to visit.    ROS Review of Systems  Constitutional:  Negative for chills, diaphoresis, fatigue and fever.  HENT:  Positive for congestion, postnasal drip and rhinorrhea. Negative for sinus pressure and sore throat.   Eyes: Negative.   Respiratory:  Positive for cough. Negative for chest tightness, shortness of breath and wheezing.   Cardiovascular:  Negative for chest pain, palpitations and leg swelling.  Gastrointestinal:  Negative for abdominal pain, constipation, diarrhea, nausea and vomiting.  Endocrine: Negative.   Genitourinary: Negative.   Musculoskeletal: Negative.   Skin: Negative.   Neurological:  Negative for dizziness, weakness and headaches.   Hematological:  Negative for adenopathy. Does not bruise/bleed easily.  Psychiatric/Behavioral: Negative.      Objective:  BP 120/82 (BP Location: Left Arm, Patient Position: Sitting, Cuff Size: Normal)   Pulse 85   Temp 98.1 F (36.7 C) (Oral)   Resp 16   Ht 5\' 7"  (1.702 m)   SpO2 97%   BMI 36.34 kg/m   BP Readings from Last 3 Encounters:  03/06/22 120/82  02/21/22 120/78  12/14/21 122/80    Wt Readings from Last 3 Encounters:  12/14/21 232 lb (105.2 kg)  09/08/21 230 lb (104.3 kg)  07/31/21 226 lb (102.5 kg)    Physical Exam Vitals reviewed.  Constitutional:      General: She is not in acute distress.    Appearance: She is not ill-appearing, toxic-appearing or diaphoretic.  HENT:     Nose: Nose normal.     Mouth/Throat:     Mouth: Mucous membranes are moist.  Eyes:     General: No scleral icterus.    Conjunctiva/sclera: Conjunctivae normal.  Cardiovascular:     Rate and Rhythm: Normal rate and regular rhythm.     Heart sounds: No murmur heard. Pulmonary:     Effort: Pulmonary effort is normal.     Breath sounds: No stridor. No wheezing, rhonchi or rales.  Abdominal:     General: Abdomen is flat.     Palpations: There is no mass.     Tenderness: There is no abdominal tenderness.  There is no guarding.     Hernia: No hernia is present.  Musculoskeletal:        General: Normal range of motion.     Cervical back: Neck supple.     Right lower leg: No edema.     Left lower leg: No edema.  Lymphadenopathy:     Cervical: No cervical adenopathy.  Skin:    General: Skin is warm and dry.  Neurological:     General: No focal deficit present.     Mental Status: She is alert.  Psychiatric:        Mood and Affect: Mood normal.        Behavior: Behavior normal.     Lab Results  Component Value Date   WBC 4.2 07/31/2021   HGB 13.3 07/31/2021   HCT 39.6 07/31/2021   PLT 271.0 07/31/2021   GLUCOSE 58 (L) 07/31/2021   CHOL 173 07/31/2021   TRIG 51.0  07/31/2021   HDL 49.80 07/31/2021   LDLCALC 113 (H) 07/31/2021   ALT 16 01/09/2022   AST 20 01/09/2022   NA 139 07/31/2021   K 3.7 07/31/2021   CL 105 07/31/2021   CREATININE 0.70 07/31/2021   BUN 11 07/31/2021   CO2 26 07/31/2021   TSH 0.62 07/31/2021   HGBA1C 5.5 07/25/2020    CT Abdomen Pelvis W Contrast  Result Date: 07/10/2020 CLINICAL DATA:  Watery stools.  Acute abdominal pain EXAM: CT ABDOMEN AND PELVIS WITH CONTRAST TECHNIQUE: Multidetector CT imaging of the abdomen and pelvis was performed using the standard protocol following bolus administration of intravenous contrast. CONTRAST:  OMNIPAQUE IOHEXOL 300 MG/ML  SOLN COMPARISON:  None. FINDINGS: Lower chest:  No contributory findings. Hepatobiliary: Subcentimeter low-density in the anterior right lobe liver which is too small for densitometry.No evidence of biliary obstruction or stone. Pancreas: Unremarkable. Spleen: Unremarkable. Adrenals/Urinary Tract: 17 mm low-density right adrenal nodule measuring -2 Hounsfield unit. No hydronephrosis or stone. Unremarkable bladder. Stomach/Bowel:  No obstruction. No appendicitis. Vascular/Lymphatic: No acute vascular abnormality. No mass or adenopathy. Reproductive:IUD in expected position. Other: No ascites or pneumoperitoneum. Musculoskeletal: No acute abnormalities. IMPRESSION: 1. No acute finding.  No visible bowel inflammation. 2. Right adrenal adenoma. Electronically Signed   By: Marnee Spring M.D.   On: 07/10/2020 05:42    Assessment & Plan:   Norma White was seen today for uri.  Diagnoses and all orders for this visit:  Acute COVID-19- Will treat with Paxlovid. -     POC COVID-19 BinaxNow -     nirmatrelvir/ritonavir (PAXLOVID) 20 x 150 MG & 10 x 100MG  TABS; Take 3 tablets by mouth 2 (two) times daily for 5 days. (Take nirmatrelvir 150 mg two tablets twice daily for 5 days and ritonavir 100 mg one tablet twice daily for 5 days) Patient GFR is 100   I am having Norma White  start on nirmatrelvir/ritonavir. I am also having her maintain her levonorgestrel, famotidine, ibuprofen, hydroquinone, triamcinolone ointment, metroNIDAZOLE, terbinafine, Naftin, and cefdinir.  Meds ordered this encounter  Medications   nirmatrelvir/ritonavir (PAXLOVID) 20 x 150 MG & 10 x 100MG  TABS    Sig: Take 3 tablets by mouth 2 (two) times daily for 5 days. (Take nirmatrelvir 150 mg two tablets twice daily for 5 days and ritonavir 100 mg one tablet twice daily for 5 days) Patient GFR is 100    Dispense:  30 tablet    Refill:  0     Follow-up: Return if symptoms worsen or fail to  improve.  Sanda Linger, MD

## 2022-04-03 DIAGNOSIS — B351 Tinea unguium: Secondary | ICD-10-CM | POA: Diagnosis not present

## 2022-04-18 ENCOUNTER — Ambulatory Visit (INDEPENDENT_AMBULATORY_CARE_PROVIDER_SITE_OTHER): Payer: Commercial Managed Care - PPO | Admitting: Internal Medicine

## 2022-04-18 ENCOUNTER — Encounter: Payer: Self-pay | Admitting: Internal Medicine

## 2022-04-18 VITALS — BP 124/72 | HR 75 | Temp 97.8°F | Resp 16 | Ht 67.0 in | Wt 233.0 lb

## 2022-04-18 DIAGNOSIS — L03031 Cellulitis of right toe: Secondary | ICD-10-CM | POA: Diagnosis not present

## 2022-04-18 DIAGNOSIS — L02612 Cutaneous abscess of left foot: Secondary | ICD-10-CM | POA: Insufficient documentation

## 2022-04-18 MED ORDER — NUZYRA 150 MG PO TABS: 300.0000 mg | ORAL_TABLET | Freq: Every day | ORAL | 0 refills | Status: AC

## 2022-04-18 MED ORDER — NUZYRA 150 MG PO TABS: 450.0000 mg | ORAL_TABLET | Freq: Every day | ORAL | 0 refills | Status: AC

## 2022-04-18 NOTE — Progress Notes (Signed)
Subjective:  Patient ID: Norma White, female    DOB: 1985-11-21  Age: 37 y.o. MRN: [id number]  CC: Toe Pain   HPI Norma White presents for right toe problem -  She underwent a pedicure about 5 days ago.  She tells me that the aesthetician dug out an ingrown toenail on her right great toe.  She now complains of pain, redness, and swelling in the right great toe.  She has not noticed much discharge and she denies fever, chills, or night sweats.  Outpatient Medications Prior to Visit  Medication Sig Dispense Refill   famotidine (PEPCID) 20 MG tablet Take 2 tablets (40 mg total) by mouth daily as needed for heartburn or indigestion. Take with Ibuprofen 60 tablet 3   hydroquinone 4 % cream Apply topically 2 (two) times daily. 28.4 g 1   ibuprofen (ADVIL) 800 MG tablet Take 1 tablet (800 mg total) by mouth 2 (two) times daily as needed for moderate pain. 60 tablet 3   levonorgestrel (MIRENA) 20 MCG/DAY IUD 1 each by Intrauterine route once.     NAFTIN 2 % GEL Apply topically daily.     terbinafine (LAMISIL) 250 MG tablet Take 250 mg by mouth daily.     triamcinolone ointment (KENALOG) 0.5 % Apply 1 Application topically 4 (four) times daily. 120 g 1   cefdinir (OMNICEF) 300 MG capsule Take 1 capsule (300 mg total) by mouth 2 (two) times daily. 20 capsule 0   metroNIDAZOLE (FLAGYL) 500 MG tablet Take 1 tablet (500 mg total) by mouth 3 (three) times daily. 30 tablet 1   No facility-administered medications prior to visit.    ROS Review of Systems  All other systems reviewed and are negative.   Objective:  BP 124/72 (BP Location: Left Arm, Patient Position: Sitting, Cuff Size: Large)   Pulse 75   Temp 97.8 F (36.6 C) (Oral)   Resp 16   Ht  (1.702 m)   Wt 233 lb (105.7 kg)   LMP  (LMP Unknown) Comment: IUD  SpO2 99%   Breastfeeding No   BMI 36.49 kg/m   BP Readings from Last 3 Encounters:  04/18/22 124/72  03/06/22 120/82  02/21/22 120/78    Wt Readings from  Last 3 Encounters:  04/18/22 233 lb (105.7 kg)  12/14/21 232 lb (105.2 kg)  09/08/21 230 lb (104.3 kg)    Physical Exam Vitals reviewed.  Constitutional:      Appearance: She is not ill-appearing.  Eyes:     General: No scleral icterus.    Conjunctiva/sclera: Conjunctivae normal.  Cardiovascular:     Rate and Rhythm: Normal rate and regular rhythm.     Heart sounds: No murmur heard. Pulmonary:     Effort: Pulmonary effort is normal.     Breath sounds: No stridor. No wheezing, rhonchi or rales.  Abdominal:     General: Abdomen is flat.     Palpations: There is no mass.     Tenderness: There is no abdominal tenderness. There is no guarding.     Hernia: No hernia is present.  Musculoskeletal:     Cervical back: Neck supple.  Lymphadenopathy:     Cervical: No cervical adenopathy.  Skin:    General: Skin is warm.     Findings: Erythema present.     Comments: In the right great toe the nail is not ingrown.  Over the eponychium and paronychia there is a scant amount of yellowish exudate with erythema and warmth.  There is no induration or fluctuance.  Neurological:     General: No focal deficit present.     Mental Status: She is alert.     Lab Results  Component Value Date   WBC 4.2 07/31/2021   HGB 13.3 07/31/2021   HCT 39.6 07/31/2021   PLT 271.0 07/31/2021   GLUCOSE 58 (L) 07/31/2021   CHOL 173 07/31/2021   TRIG 51.0 07/31/2021   HDL 49.80 07/31/2021   LDLCALC 113 (H) 07/31/2021   ALT 16 01/09/2022   AST 20 01/09/2022   NA 139 07/31/2021   K 3.7 07/31/2021   CL 105 07/31/2021   CREATININE 0.70 07/31/2021   BUN 11 07/31/2021   CO2 26 07/31/2021   TSH 0.62 07/31/2021   HGBA1C 5.5 07/25/2020    CT Abdomen Pelvis W Contrast  Result Date: 07/10/2020 CLINICAL DATA:  Watery stools.  Acute abdominal pain EXAM: CT ABDOMEN AND PELVIS WITH CONTRAST TECHNIQUE: Multidetector CT imaging of the abdomen and pelvis was performed using the standard protocol following bolus  administration of intravenous contrast. CONTRAST:  OMNIPAQUE IOHEXOL 300 MG/ML  SOLN COMPARISON:  None. FINDINGS: Lower chest:  No contributory findings. Hepatobiliary: Subcentimeter low-density in the anterior right lobe liver which is too small for densitometry.No evidence of biliary obstruction or stone. Pancreas: Unremarkable. Spleen: Unremarkable. Adrenals/Urinary Tract: 17 mm low-density right adrenal nodule measuring -2 Hounsfield unit. No hydronephrosis or stone. Unremarkable bladder. Stomach/Bowel:  No obstruction. No appendicitis. Vascular/Lymphatic: No acute vascular abnormality. No mass or adenopathy. Reproductive:IUD in expected position. Other: No ascites or pneumoperitoneum. Musculoskeletal: No acute abnormalities. IMPRESSION: 1. No acute finding.  No visible bowel inflammation. 2. Right adrenal adenoma. Electronically Signed   By: Marnee Spring M.D.   On: 07/10/2020 05:42    Assessment & Plan:   Cellulitis of great toe, right- Will cover for strep, staph, and other bacteria. Will treat with a broad spectrum antibiotic. Riki Altes; Take 3 tablets (450 mg total) by mouth daily for 2 days.  Dispense: 6 tablet; Refill: 0 -     Nuzyra; Take 2 tablets (300 mg total) by mouth daily for 7 days.  Dispense: 14 tablet; Refill: 0     Follow-up: Return in about 3 weeks (around 05/09/2022).  Sanda Linger, MD

## 2022-04-18 NOTE — Patient Instructions (Signed)

## 2022-04-20 ENCOUNTER — Telehealth: Payer: Self-pay | Admitting: Internal Medicine

## 2022-04-20 NOTE — Telephone Encounter (Signed)
Completed PA w/ (Key: [id number]) Rec'd nmsg your information has been sent to [employer].Marland KitchenRaechel Chute

## 2022-04-20 NOTE — Telephone Encounter (Signed)
[facility] called for PA for Omadacycline Tosylate (NUZYRA) 150 MG TABS    Key Code [id number]

## 2022-04-23 NOTE — Telephone Encounter (Signed)
Check status on PA still Pending../lmb 

## 2022-04-24 ENCOUNTER — Telehealth: Payer: Self-pay

## 2022-04-24 NOTE — Telephone Encounter (Signed)
No../lmb

## 2022-04-24 NOTE — Telephone Encounter (Signed)
Thank you, I have faxed back to insurance.

## 2022-04-24 NOTE — Telephone Encounter (Signed)
Received request for additional information from [employer], asking:

## 2022-04-24 NOTE — Telephone Encounter (Signed)
Checking status still pending.Marland KitchenRaechel White

## 2022-04-26 NOTE — Telephone Encounter (Signed)
Check status on PA Med was Denied. It states "This request has not been approved. Based on the information sent for review, the requested drug did not meet our guideline rules.".Marland KitchenRaechel Chute

## 2022-04-27 NOTE — Telephone Encounter (Signed)
Please check with Dr. Yetta Barre.  He should know what to do.  Thanks

## 2022-04-27 NOTE — Telephone Encounter (Signed)
Pt never was on med.Marland KitchenRaechel White

## 2022-05-09 NOTE — Telephone Encounter (Signed)
Patient Advocate Encounter  Received a fax from [employer] regarding Prior Authorization for Nuzyra 150MG  tablet.   Authorization has been DENIED due to    Determination letter attached to patient chart

## 2022-06-28 ENCOUNTER — Ambulatory Visit: Payer: Commercial Managed Care - PPO | Admitting: Family Medicine

## 2022-07-02 ENCOUNTER — Other Ambulatory Visit: Payer: Self-pay | Admitting: Internal Medicine

## 2022-07-02 DIAGNOSIS — L0201 Cutaneous abscess of face: Secondary | ICD-10-CM

## 2022-07-02 MED ORDER — SULFAMETHOXAZOLE-TRIMETHOPRIM 800-160 MG PO TABS
1.0000 | ORAL_TABLET | Freq: Three times a day (TID) | ORAL | 0 refills | Status: AC
Start: 2022-07-02 — End: 2022-07-09

## 2022-07-04 ENCOUNTER — Ambulatory Visit: Payer: Commercial Managed Care - PPO | Admitting: Family Medicine

## 2022-07-12 DIAGNOSIS — B351 Tinea unguium: Secondary | ICD-10-CM | POA: Diagnosis not present

## 2022-08-02 ENCOUNTER — Ambulatory Visit: Payer: Commercial Managed Care - PPO | Admitting: Internal Medicine

## 2022-08-02 ENCOUNTER — Encounter: Payer: Self-pay | Admitting: Internal Medicine

## 2022-08-02 VITALS — BP 130/84 | HR 74 | Temp 97.8°F | Ht 67.0 in | Wt 245.0 lb

## 2022-08-02 DIAGNOSIS — Z0000 Encounter for general adult medical examination without abnormal findings: Secondary | ICD-10-CM

## 2022-08-02 DIAGNOSIS — Z13220 Encounter for screening for lipoid disorders: Secondary | ICD-10-CM

## 2022-08-02 LAB — CBC WITH DIFFERENTIAL/PLATELET
Basophils Absolute: 0.1 10*3/uL (ref 0.0–0.1)
Basophils Relative: 0.8 % (ref 0.0–3.0)
Eosinophils Absolute: 0.1 10*3/uL (ref 0.0–0.7)
Eosinophils Relative: 2.2 % (ref 0.0–5.0)
HCT: 41.2 % (ref 36.0–46.0)
Hemoglobin: 13.5 g/dL (ref 12.0–15.0)
Lymphocytes Relative: 35.4 % (ref 12.0–46.0)
Lymphs Abs: 2.2 10*3/uL (ref 0.7–4.0)
MCHC: 32.7 g/dL (ref 30.0–36.0)
MCV: 88.4 fl (ref 78.0–100.0)
Monocytes Absolute: 0.5 10*3/uL (ref 0.1–1.0)
Monocytes Relative: 7.6 % (ref 3.0–12.0)
Neutro Abs: 3.4 10*3/uL (ref 1.4–7.7)
Neutrophils Relative %: 54 % (ref 43.0–77.0)
Platelets: 312 10*3/uL (ref 150.0–400.0)
RBC: 4.66 Mil/uL (ref 3.87–5.11)
RDW: 13.6 % (ref 11.5–15.5)
WBC: 6.3 10*3/uL (ref 4.0–10.5)

## 2022-08-02 LAB — URINALYSIS
Bilirubin Urine: NEGATIVE
Hgb urine dipstick: NEGATIVE
Ketones, ur: NEGATIVE
Leukocytes,Ua: NEGATIVE
Nitrite: NEGATIVE
Specific Gravity, Urine: 1.02 (ref 1.000–1.030)
Total Protein, Urine: NEGATIVE
Urine Glucose: NEGATIVE
Urobilinogen, UA: 0.2 (ref 0.0–1.0)
pH: 6.5 (ref 5.0–8.0)

## 2022-08-02 LAB — COMPREHENSIVE METABOLIC PANEL
ALT: 11 U/L (ref 0–35)
AST: 16 U/L (ref 0–37)
Albumin: 4.1 g/dL (ref 3.5–5.2)
Alkaline Phosphatase: 70 U/L (ref 39–117)
BUN: 13 mg/dL (ref 6–23)
CO2: 27 mEq/L (ref 19–32)
Calcium: 9.1 mg/dL (ref 8.4–10.5)
Chloride: 103 mEq/L (ref 96–112)
Creatinine, Ser: 0.73 mg/dL (ref 0.40–1.20)
GFR: 106.9 mL/min (ref >60.00)
Glucose, Bld: 77 mg/dL (ref 70–99)
Potassium: 3.8 mEq/L (ref 3.5–5.1)
Sodium: 138 mEq/L (ref 135–145)
Total Bilirubin: 0.4 mg/dL (ref 0.2–1.2)
Total Protein: 8 g/dL (ref 6.0–8.3)

## 2022-08-02 LAB — LIPID PANEL
Cholesterol: 163 mg/dL (ref 0–200)
HDL: 51.9 mg/dL (ref >39.00)
LDL Cholesterol: 103 mg/dL — ABNORMAL HIGH (ref 0–99)
NonHDL: 111.33
Total CHOL/HDL Ratio: 3
Triglycerides: 40 mg/dL (ref 0.0–149.0)
VLDL: 8 mg/dL (ref 0.0–40.0)

## 2022-08-02 LAB — TSH: TSH: 0.6 u[IU]/mL (ref 0.35–5.50)

## 2022-08-02 MED ORDER — VITAMIN D3 50 MCG (2000 UT) PO CAPS
2000.0000 [IU] | ORAL_CAPSULE | Freq: Every day | ORAL | Status: DC
Start: 2022-08-02 — End: 2023-05-13

## 2022-08-02 MED ORDER — IBUPROFEN 800 MG PO TABS: 800.0000 mg | ORAL_TABLET | Freq: Two times a day (BID) | ORAL | 3 refills | Status: DC | PRN

## 2022-08-02 MED ORDER — MULTIVITAMIN GUMMIES WOMENS PO CHEW: CHEWABLE_TABLET | ORAL | Status: DC

## 2022-08-02 NOTE — Assessment & Plan Note (Signed)
We discussed age appropriate health related issues, including available/recomended screening tests and vaccinations. We discussed a need for adhering to healthy diet and exercise. Labs were ordered to be later reviewed . All questions were answered. Ophth q 1-2 year - glasses

## 2022-08-02 NOTE — Progress Notes (Signed)
Subjective:  Patient ID: Norma White, female    DOB: April 14, 1985  Age: 37 y.o. MRN: [id number]  CC: Annual Exam (No concerns )   HPI Norma White presents for a well exam  Outpatient Medications Prior to Visit  Medication Sig Dispense Refill   famotidine (PEPCID) 20 MG tablet Take 2 tablets (40 mg total) by mouth daily as needed for heartburn or indigestion. Take with Ibuprofen 60 tablet 3   hydroquinone 4 % cream Apply topically 2 (two) times daily. 28.4 g 1   levonorgestrel (MIRENA) 20 MCG/DAY IUD 1 each by Intrauterine route once.     triamcinolone ointment (KENALOG) 0.5 % Apply 1 Application topically 4 (four) times daily. 120 g 1   ibuprofen (ADVIL) 800 MG tablet Take 1 tablet (800 mg total) by mouth 2 (two) times daily as needed for moderate pain. 60 tablet 3   NAFTIN 2 % GEL Apply topically daily.     terbinafine (LAMISIL) 250 MG tablet Take 250 mg by mouth daily.     No facility-administered medications prior to visit.    ROS: Review of Systems  Constitutional:  Positive for unexpected weight change. Negative for activity change, appetite change, chills and fatigue.  HENT:  Negative for congestion, mouth sores and sinus pressure.   Eyes:  Negative for visual disturbance.  Respiratory:  Negative for cough and chest tightness.   Gastrointestinal:  Negative for abdominal pain and nausea.  Genitourinary:  Negative for difficulty urinating, frequency and vaginal pain.  Musculoskeletal:  Negative for back pain and gait problem.  Skin:  Negative for pallor and rash.  Neurological:  Negative for dizziness, tremors, weakness, numbness and headaches.  Psychiatric/Behavioral:  Negative for confusion, sleep disturbance and suicidal ideas.     Objective:  BP 130/84 (BP Location: Right Arm, Patient Position: Sitting, Cuff Size: Normal)   Pulse 74   Temp 97.8 F (36.6 C) (Oral)   Ht 5\' 7"  (1.702 m)   Wt 245 lb (111.1 kg)   SpO2 96%   BMI 38.37 kg/m   BP Readings from  Last 3 Encounters:  08/02/22 130/84  04/18/22 124/72  03/06/22 120/82    Wt Readings from Last 3 Encounters:  08/02/22 245 lb (111.1 kg)  04/18/22 233 lb (105.7 kg)  12/14/21 232 lb (105.2 kg)    Physical Exam Constitutional:      General: She is not in acute distress.    Appearance: She is well-developed. She is obese.  HENT:     Head: Normocephalic.     Right Ear: External ear normal.     Left Ear: External ear normal.     Nose: Nose normal.  Eyes:     General:        Right eye: No discharge.        Left eye: No discharge.     Conjunctiva/sclera: Conjunctivae normal.     Pupils: Pupils are equal, round, and reactive to light.  Neck:     Thyroid: No thyromegaly.     Vascular: No JVD.     Trachea: No tracheal deviation.  Cardiovascular:     Rate and Rhythm: Normal rate and regular rhythm.     Heart sounds: Normal heart sounds.  Pulmonary:     Effort: No respiratory distress.     Breath sounds: No stridor. No wheezing.  Abdominal:     General: Bowel sounds are normal. There is no distension.     Palpations: Abdomen is soft. There is no mass.  Tenderness: There is no abdominal tenderness. There is no guarding or rebound.  Musculoskeletal:        General: No tenderness.     Cervical back: Normal range of motion and neck supple. No rigidity.  Lymphadenopathy:     Cervical: No cervical adenopathy.  Skin:    Findings: No erythema or rash.  Neurological:     Cranial Nerves: No cranial nerve deficit.     Motor: No abnormal muscle tone.     Coordination: Coordination normal.     Deep Tendon Reflexes: Reflexes normal.  Psychiatric:        Behavior: Behavior normal.        Thought Content: Thought content normal.        Judgment: Judgment normal.     Lab Results  Component Value Date   WBC 4.2 07/31/2021   HGB 13.3 07/31/2021   HCT 39.6 07/31/2021   PLT 271.0 07/31/2021   GLUCOSE 58 (L) 07/31/2021   CHOL 173 07/31/2021   TRIG 51.0 07/31/2021   HDL 49.80  07/31/2021   LDLCALC 113 (H) 07/31/2021   ALT 16 01/09/2022   AST 20 01/09/2022   NA 139 07/31/2021   K 3.7 07/31/2021   CL 105 07/31/2021   CREATININE 0.70 07/31/2021   BUN 11 07/31/2021   CO2 26 07/31/2021   TSH 0.62 07/31/2021   HGBA1C 5.5 07/25/2020    CT Abdomen Pelvis W Contrast  Result Date: 07/10/2020 CLINICAL DATA:  Watery stools.  Acute abdominal pain EXAM: CT ABDOMEN AND PELVIS WITH CONTRAST TECHNIQUE: Multidetector CT imaging of the abdomen and pelvis was performed using the standard protocol following bolus administration of intravenous contrast. CONTRAST:  OMNIPAQUE IOHEXOL 300 MG/ML  SOLN COMPARISON:  None. FINDINGS: Lower chest:  No contributory findings. Hepatobiliary: Subcentimeter low-density in the anterior right lobe liver which is too small for densitometry.No evidence of biliary obstruction or stone. Pancreas: Unremarkable. Spleen: Unremarkable. Adrenals/Urinary Tract: 17 mm low-density right adrenal nodule measuring -2 Hounsfield unit. No hydronephrosis or stone. Unremarkable bladder. Stomach/Bowel:  No obstruction. No appendicitis. Vascular/Lymphatic: No acute vascular abnormality. No mass or adenopathy. Reproductive:IUD in expected position. Other: No ascites or pneumoperitoneum. Musculoskeletal: No acute abnormalities. IMPRESSION: 1. No acute finding.  No visible bowel inflammation. 2. Right adrenal adenoma. Electronically Signed   By: Marnee Spring M.D.   On: 07/10/2020 05:42    Assessment & Plan:   Problem List Items Addressed This Visit     Well adult exam - Primary   Relevant Medications   Cholecalciferol (VITAMIN D3) 50 MCG (2000 UT) capsule   Other Relevant Orders   TSH   Urinalysis   CBC with Differential/Platelet   Lipid panel   Comprehensive metabolic panel      Meds ordered this encounter  Medications   ibuprofen (ADVIL) 800 MG tablet    Sig: Take 1 tablet (800 mg total) by mouth 2 (two) times daily as needed for moderate pain.     Dispense:  60 tablet    Refill:  3   Multiple Vitamins-Minerals (MULTIVITAMIN GUMMIES WOMENS) CHEW    Sig: qd   Cholecalciferol (VITAMIN D3) 50 MCG (2000 UT) capsule    Sig: Take 1 capsule (2,000 Units total) by mouth daily.      Follow-up: Return in about 3 months (around 11/02/2022) for a follow-up visit.  Sonda Primes, MD

## 2022-08-08 ENCOUNTER — Telehealth: Payer: Self-pay

## 2022-08-08 NOTE — Telephone Encounter (Signed)
Pt has asked if provider is willing to add her on pr squeeze her in for 08/09/2022 or 08/13/2022 as she is needing to see PCP to discuss some health concerns she is having.

## 2022-08-12 NOTE — Telephone Encounter (Signed)
Okay. Thank you.

## 2022-08-15 NOTE — Telephone Encounter (Signed)
I thought I meant yes by saying OK. Thanks

## 2022-08-28 ENCOUNTER — Ambulatory Visit: Payer: Commercial Managed Care - PPO | Admitting: Internal Medicine

## 2022-09-22 ENCOUNTER — Other Ambulatory Visit (HOSPITAL_COMMUNITY): Payer: Self-pay

## 2022-09-24 ENCOUNTER — Other Ambulatory Visit: Payer: Self-pay

## 2022-09-24 ENCOUNTER — Other Ambulatory Visit (HOSPITAL_COMMUNITY): Payer: Self-pay

## 2022-09-25 ENCOUNTER — Encounter: Payer: Self-pay | Admitting: Internal Medicine

## 2022-09-25 ENCOUNTER — Ambulatory Visit: Payer: Commercial Managed Care - PPO | Admitting: Internal Medicine

## 2022-09-25 ENCOUNTER — Other Ambulatory Visit: Payer: Self-pay

## 2022-09-25 VITALS — BP 110/70 | HR 73 | Temp 98.4°F | Ht 67.0 in

## 2022-09-25 DIAGNOSIS — F419 Anxiety disorder, unspecified: Secondary | ICD-10-CM

## 2022-09-25 MED ORDER — ESCITALOPRAM OXALATE 5 MG PO TABS: 5.0000 mg | ORAL_TABLET | Freq: Every day | ORAL | 5 refills | Status: DC

## 2022-09-25 NOTE — Progress Notes (Signed)
Subjective:  Patient ID: Norma White, female    DOB: 1985-09-28  Age: 37 y.o. MRN: [id number]  CC: Follow-up   HPI Norma White presents for anxiety x long time  - worse lately.Norma White has been worrying about a lot of different things: Norma White, Norma White, Norma White etc. She is not depressed C/o insomnia sometimes   Outpatient Medications Prior to Visit  Medication Sig Dispense Refill   Cholecalciferol (VITAMIN D3) 50 MCG (2000 UT) capsule Take 1 capsule (2,000 Units total) by mouth daily.     hydroquinone 4 % cream Apply topically 2 (two) times daily. 28.4 g 1   ibuprofen (ADVIL) 800 MG tablet Take 1 tablet (800 mg total) by mouth 2 (two) times daily as needed for moderate pain. 60 tablet 3   levonorgestrel (MIRENA) 20 MCG/DAY IUD 1 each by Intrauterine route once.     Multiple Vitamins-Minerals (MULTIVITAMIN GUMMIES WOMENS) CHEW qd     triamcinolone ointment (KENALOG) 0.5 % Apply 1 Application topically 4 (four) times daily. 120 g 1   famotidine (PEPCID) 20 MG tablet Take 2 tablets (40 mg total) by mouth daily as needed for heartburn or indigestion. Take with Ibuprofen 60 tablet 3   No facility-administered medications prior to visit.    ROS: Review of Systems  Constitutional:  Negative for activity change, appetite change, chills, fatigue and unexpected weight change.  HENT:  Negative for congestion, mouth sores and sinus pressure.   Eyes:  Negative for visual disturbance.  Respiratory:  Negative for cough and chest tightness.   Gastrointestinal:  Negative for abdominal pain and nausea.  Genitourinary:  Negative for difficulty urinating, frequency and vaginal pain.  Musculoskeletal:  Negative for back pain and gait problem.  Skin:  Negative for pallor and rash.  Neurological:  Negative for dizziness, tremors, weakness, numbness and headaches.  Psychiatric/Behavioral:  Positive for sleep disturbance. Negative for behavioral problems, confusion, dysphoric mood and  suicidal ideas. The patient is nervous/anxious.     Objective:  BP 110/70 (BP Location: Left Arm, Patient Position: Sitting, Cuff Size: Normal)   Pulse 73   Temp 98.4 F (36.9 C) (Oral)   Ht 5\' 7"  (1.702 m)   BMI 38.37 kg/m   BP Readings from Last 3 Encounters:  09/25/22 110/70  08/02/22 130/84  04/18/22 124/72    Wt Readings from Last 3 Encounters:  08/02/22 245 lb (111.1 kg)  04/18/22 233 lb (105.7 kg)  12/14/21 232 lb (105.2 kg)    Physical Exam Constitutional:      General: She is not in acute distress.    Appearance: Normal appearance. She is well-developed. She is not ill-appearing.  HENT:     Head: Normocephalic.     Right Ear: External ear normal.     Left Ear: External ear normal.     Nose: Nose normal.  Eyes:     Conjunctiva/sclera: Conjunctivae normal.     Pupils: Pupils are equal, round, and reactive to light.  Neck:     Thyroid: No thyromegaly.     Vascular: No JVD.     Trachea: No tracheal deviation.  Cardiovascular:     Rate and Rhythm: Normal rate and regular rhythm.     Heart sounds: Normal heart sounds.  Abdominal:     General: Bowel sounds are normal.     Palpations: Abdomen is soft.  Musculoskeletal:     Cervical back: Normal range of motion and neck supple. No rigidity.  Lymphadenopathy:     Cervical: No  cervical adenopathy.  Neurological:     Mental Status: She is oriented to person, place, and time.     Cranial Nerves: No cranial nerve deficit.     Motor: No abnormal muscle tone.     Coordination: Coordination normal.     Deep Tendon Reflexes: Reflexes normal.  Psychiatric:        Mood and Affect: Mood normal.        Behavior: Behavior normal.        Thought Content: Thought content normal.        Judgment: Judgment normal.     Lab Results  Component Value Date   WBC 6.3 08/02/2022   HGB 13.5 08/02/2022   HCT 41.2 08/02/2022   PLT 312.0 08/02/2022   GLUCOSE 77 08/02/2022   CHOL 163 08/02/2022   TRIG 40.0 08/02/2022   HDL  51.90 08/02/2022   LDLCALC 103 (H) 08/02/2022   ALT 11 08/02/2022   AST 16 08/02/2022   NA 138 08/02/2022   K 3.8 08/02/2022   CL 103 08/02/2022   CREATININE 0.73 08/02/2022   BUN 13 08/02/2022   CO2 27 08/02/2022   TSH 0.60 08/02/2022   HGBA1C 5.5 07/25/2020    CT Abdomen Pelvis W Contrast  Result Date: 07/10/2020 CLINICAL DATA:  Watery stools.  Acute abdominal pain EXAM: CT ABDOMEN AND PELVIS WITH CONTRAST TECHNIQUE: Multidetector CT imaging of the abdomen and pelvis was performed using the standard protocol following bolus administration of intravenous contrast. CONTRAST:  OMNIPAQUE IOHEXOL 300 MG/ML  SOLN COMPARISON:  None. FINDINGS: Lower chest:  No contributory findings. Hepatobiliary: Subcentimeter low-density in the anterior right lobe liver which is too small for densitometry.No evidence of biliary obstruction or stone. Pancreas: Unremarkable. Spleen: Unremarkable. Adrenals/Urinary Tract: 17 mm low-density right adrenal nodule measuring -2 Hounsfield unit. No hydronephrosis or stone. Unremarkable bladder. Stomach/Bowel:  No obstruction. No appendicitis. Vascular/Lymphatic: No acute vascular abnormality. No mass or adenopathy. Reproductive:IUD in expected position. Other: No ascites or pneumoperitoneum. Musculoskeletal: No acute abnormalities. IMPRESSION: 1. No acute finding.  No visible bowel inflammation. 2. Right adrenal adenoma. Electronically Signed   By: Marnee Spring M.D.   On: 07/10/2020 05:42    Assessment & Plan:   Problem List Items Addressed This Visit     Anxiety - Primary    Chronic anxiety- worse lately Start Lexapro low dose -5 mg daily Lachina can start 1/2 tablet at night for 2 nights, then 1 nightly         Relevant Medications   escitalopram (LEXAPRO) 5 MG tablet      Meds ordered this encounter  Medications   escitalopram (LEXAPRO) 5 MG tablet    Sig: Take 1 tablet (5 mg total) by mouth at bedtime.    Dispense:  30 tablet    Refill:  5       Follow-up: Return in about 6 weeks (around 11/06/2022) for a follow-up visit.  Sonda Primes, MD

## 2022-09-25 NOTE — Assessment & Plan Note (Addendum)
Chronic anxiety- worse lately Start Lexapro low dose -5 mg daily [name] can start 1/2 tablet at night for 2 nights, then 1 nightly

## 2022-10-15 ENCOUNTER — Other Ambulatory Visit: Payer: Self-pay

## 2022-10-15 DIAGNOSIS — K580 Irritable bowel syndrome with diarrhea: Secondary | ICD-10-CM

## 2022-10-15 MED ORDER — METRONIDAZOLE 500 MG PO TABS
500.0000 mg | ORAL_TABLET | Freq: Three times a day (TID) | ORAL | 2 refills | Status: DC
Start: 2022-10-15 — End: 2023-05-13

## 2022-10-21 ENCOUNTER — Other Ambulatory Visit: Payer: Self-pay | Admitting: Internal Medicine

## 2022-10-29 ENCOUNTER — Other Ambulatory Visit: Payer: Self-pay | Admitting: Internal Medicine

## 2022-10-29 MED ORDER — CEPHALEXIN 500 MG PO CAPS: 500.0000 mg | ORAL_CAPSULE | Freq: Four times a day (QID) | ORAL | 1 refills | Status: DC

## 2022-10-29 NOTE — Progress Notes (Signed)
UTI

## 2022-11-16 DIAGNOSIS — B351 Tinea unguium: Secondary | ICD-10-CM | POA: Diagnosis not present

## 2022-11-24 ENCOUNTER — Other Ambulatory Visit: Payer: Self-pay | Admitting: Internal Medicine

## 2022-12-14 DIAGNOSIS — B351 Tinea unguium: Secondary | ICD-10-CM | POA: Diagnosis not present

## 2022-12-19 ENCOUNTER — Telehealth: Payer: Self-pay | Admitting: Internal Medicine

## 2022-12-19 MED ORDER — CEFDINIR 300 MG PO CAPS: 300.0000 mg | ORAL_CAPSULE | Freq: Two times a day (BID) | ORAL | 0 refills | Status: DC

## 2022-12-19 NOTE — Telephone Encounter (Signed)
Ear infection Rx sent

## 2023-01-02 DIAGNOSIS — E538 Deficiency of other specified B group vitamins: Secondary | ICD-10-CM

## 2023-01-02 HISTORY — DX: Deficiency of other specified B group vitamins: E53.8

## 2023-01-04 ENCOUNTER — Other Ambulatory Visit (HOSPITAL_COMMUNITY): Payer: Self-pay

## 2023-01-18 DIAGNOSIS — Z01419 Encounter for gynecological examination (general) (routine) without abnormal findings: Secondary | ICD-10-CM | POA: Diagnosis not present

## 2023-01-18 DIAGNOSIS — Z6837 Body mass index (BMI) 37.0-37.9, adult: Secondary | ICD-10-CM | POA: Diagnosis not present

## 2023-02-20 ENCOUNTER — Other Ambulatory Visit: Payer: Self-pay

## 2023-02-20 DIAGNOSIS — L700 Acne vulgaris: Secondary | ICD-10-CM

## 2023-02-20 MED ORDER — ARAZLO 0.045 % EX LOTN: 1.0000 | TOPICAL_LOTION | Freq: Every evening | CUTANEOUS | 2 refills | Status: DC

## 2023-03-08 ENCOUNTER — Other Ambulatory Visit: Payer: Self-pay | Admitting: Internal Medicine

## 2023-03-10 MED ORDER — TRIAMCINOLONE ACETONIDE 0.5 % EX OINT
1.0000 | TOPICAL_OINTMENT | Freq: Four times a day (QID) | CUTANEOUS | 1 refills | Status: AC
Start: 2023-03-10 — End: ?
  Filled 2023-03-10: qty 120, 30d supply, fill #0

## 2023-03-11 ENCOUNTER — Other Ambulatory Visit: Payer: Self-pay

## 2023-05-13 ENCOUNTER — Encounter: Payer: Self-pay | Admitting: Dermatology

## 2023-05-13 ENCOUNTER — Ambulatory Visit: Admitting: Dermatology

## 2023-05-13 DIAGNOSIS — D225 Melanocytic nevi of trunk: Secondary | ICD-10-CM | POA: Diagnosis not present

## 2023-05-13 DIAGNOSIS — L821 Other seborrheic keratosis: Secondary | ICD-10-CM | POA: Diagnosis not present

## 2023-05-13 DIAGNOSIS — D1801 Hemangioma of skin and subcutaneous tissue: Secondary | ICD-10-CM

## 2023-05-13 DIAGNOSIS — Z7189 Other specified counseling: Secondary | ICD-10-CM | POA: Diagnosis not present

## 2023-05-13 DIAGNOSIS — Z1283 Encounter for screening for malignant neoplasm of skin: Secondary | ICD-10-CM | POA: Diagnosis not present

## 2023-05-13 DIAGNOSIS — L814 Other melanin hyperpigmentation: Secondary | ICD-10-CM | POA: Diagnosis not present

## 2023-05-13 DIAGNOSIS — L309 Dermatitis, unspecified: Secondary | ICD-10-CM | POA: Diagnosis not present

## 2023-05-13 DIAGNOSIS — L813 Cafe au lait spots: Secondary | ICD-10-CM

## 2023-05-13 DIAGNOSIS — L700 Acne vulgaris: Secondary | ICD-10-CM | POA: Diagnosis not present

## 2023-05-13 DIAGNOSIS — D492 Neoplasm of unspecified behavior of bone, soft tissue, and skin: Secondary | ICD-10-CM | POA: Diagnosis not present

## 2023-05-13 DIAGNOSIS — D485 Neoplasm of uncertain behavior of skin: Secondary | ICD-10-CM

## 2023-05-13 DIAGNOSIS — D229 Melanocytic nevi, unspecified: Secondary | ICD-10-CM

## 2023-05-13 MED ORDER — CLOBETASOL PROPIONATE 0.05 % EX CREA: 1.0000 | TOPICAL_CREAM | Freq: Two times a day (BID) | CUTANEOUS | 6 refills | Status: DC

## 2023-05-13 MED ORDER — SPIRONOLACTONE 100 MG PO TABS
100.0000 mg | ORAL_TABLET | Freq: Every day | ORAL | 6 refills | Status: AC
Start: 2023-05-13 — End: ?

## 2023-05-13 NOTE — Progress Notes (Signed)
 New Patient Visit   Subjective  Norma White is a 38 y.o. female who presents for the following:  Total Body Skin Exam (TBSE)  Patient present today for new patient visit for TBSE.The patient reports she has spots, moles and lesions to be evaluated, some may be new or changing and the patient may have concern these could be cancer. Patient has previously been treated by a dermatologist.Patient reports she does not have hx of bx. Patient denies family history of skin cancers. Patient reports throughout her lifetime has had moderate sun exposure. Currently, patient reports if she has excessive sun exposure, she does apply sunscreen and/or wears protective coverings.  The following portions of the chart were reviewed this encounter and updated as appropriate: medications, allergies, medical history  Review of Systems:  No other skin or systemic complaints except as noted in HPI or Assessment and Plan.  Objective  Well appearing patient in no apparent distress; mood and affect are within normal limits.  A full examination was performed including scalp, head, eyes, ears, nose, lips, neck, chest, axillae, abdomen, back, buttocks, bilateral upper extremities, bilateral lower extremities, hands, feet, fingers, toes, fingernails, and toenails. All findings within normal limits unless otherwise noted below.   Relevant exam findings are noted in the Assessment and Plan.  Left lateral Breast 6 mm black macule with radial streaking  Assessment & Plan   LENTIGINES, SEBORRHEIC KERATOSES, HEMANGIOMAS - Benign normal skin lesions - Benign-appearing - Call for any changes  MELANOCYTIC NEVI - Tan-brown and/or pink-flesh-colored symmetric macules and papules - Benign appearing on exam today - Observation - Call clinic for new or changing moles - Recommend daily use of broad spectrum spf 30+ sunscreen to sun-exposed areas.   SKIN EDUCATION - Recommend daily broad spectrum sunscreen SPF 30+ to  sun-exposed areas, reapply every 2 hours as needed.  - Staying in the shade or wearing long sleeves, sun glasses (UVA+UVB protection) and wide brim hats (4-inch brim around the entire circumference of the hat) are also recommended for sun protection.  - Call for new or changing lesions.  ACNE VULGARIS Exam: Open comedones and inflammatory papules  Not At goal  - Assessment: Patient is currently using Arazlo  (tazarotene ) Monday through Friday for acne management. No oral medications have been used previously. Patient reports no immediate plans for pregnancy, allowing for consideration of additional treatment options. - Plan:    Continue Arazlo  (tazarotene ) Monday through Friday    Add spironolactone 100 mg PO daily     - Informed patient to discontinue immediately if pregnancy occurs     - Advised about potential for irregular menstrual cycles     - Emphasized importance of adequate hydration    Recommend Dianne Fortune beta peels for weekend exfoliation, starting with ultra-gentle formulation    Follow up in 4 months to assess response to spironolactone     - May consider increasing to 150 mg if needed for hormonal influence  Eczematous Dermatitis  Exam: Scaly pink papules coalescing to plaques  Treatment Plan: - Prescribed clobetasol 0.05% to apply 2 times daily for 2 weeks  - Recommended incorporating Dianne Fortune Gentle Peel  - Plan to follow up in 4 months to reassess  SKIN CANCER SCREENING PERFORMED TODAY  NEOPLASM OF UNCERTAIN BEHAVIOR OF SKIN Left lateral Breast Epidermal / dermal shaving  Lesion diameter (cm):  0.6 Informed consent: discussed and consent obtained   Timeout: patient name, date of birth, surgical site, and procedure verified   Procedure prep:  Patient was prepped and draped in usual sterile fashion Prep type:  Isopropyl alcohol Anesthesia: the lesion was anesthetized in a standard fashion   Anesthetic:  1% lidocaine  w/ epinephrine 1-100,000 buffered w/  8.4% NaHCO3 Instrument used: flexible razor blade   Hemostasis achieved with: pressure, aluminum chloride and electrodesiccation   Outcome: patient tolerated procedure well   Post-procedure details: sterile dressing applied and wound care instructions given   Dressing type: bandage and petrolatum   Specimen 1 - Surgical pathology Differential Diagnosis: R/O DN  Check Margins: No  Return in about 5 weeks (around 06/17/2023) for Acne F/U.  I, Jetta Ager, am acting as Neurosurgeon for [employer], DO.  Documentation: I have reviewed the above documentation for accuracy and completeness, and I agree with the above.  Louana Roup, DO

## 2023-05-13 NOTE — Patient Instructions (Addendum)
 Hello Aubrii,  Thank you for visiting today. Here is a summary of the key instructions:  - Medications:   - Continue using Arazlo  Monday through Friday for acne   - Start taking spironolactone 100 mg once a day   - Use clobetasol twice a day for up to 2 weeks for dermatitis on chest and back  - Skin Care:   - Try beta peels from Dianne Fortune, starting with ultra-gentle ones every other Saturday   - Continue using Radiant Tone Serum, then Arazlo , followed by Night Cream   - Use Ciclafate as recommended  - Sun Protection:   - Apply sunscreen to tattoos to prevent fading  - Follow-up:   - Return for a follow-up appointment in 4 months to check progress with spironolactone  - Important Notes:   - Stop taking spironolactone immediately if you become pregnant   - Drink enough water while taking spironolactone   - Watch for irregular menstrual cycles while on spironolactone   - When getting a pedicure, look for dark streaks on toenails  - Procedure:   - A mole will be removed and sent to the lab for testing  Please reach out if you have any questions or concerns.  Warm regards,  Dr. Louana Roup [facility]at[facility]      Important Information   Due to recent changes in healthcare laws, you may see results of your pathology and/or laboratory studies on MyChart before the doctors have had a chance to review them. We understand that in some cases there may be results that are confusing or concerning to you. Please understand that not all results are received at the same time and often the doctors may need to interpret multiple results in order to provide you with the best plan of care or course of treatment. Therefore, we ask that you please give us  2 business days to thoroughly review all your results before contacting the office for clarification. Should we see a critical lab result, you will be contacted sooner.     If You Need Anything After Your Visit   If you have any questions  or concerns for your doctor, please call our main line at [phone]. If no one answers, please leave a voicemail as directed and we will return your call as soon as possible. Messages left after 4 pm will be answered the following business day.    You may also send us  a message via MyChart. We typically respond to MyChart messages within 1-2 business days.  For prescription refills, please ask your pharmacy to contact our office. Our fax number is [phone].  If you have an urgent issue when the clinic is closed that cannot wait until the next business day, you can page your doctor at the number below.     Please note that while we do our best to be available for urgent issues outside of office hours, we are not available 24/7.    If you have an urgent issue and are unable to reach us , you may choose to seek medical care at your doctor's office, retail clinic, urgent care center, or emergency room.   If you have a medical emergency, please immediately call 911 or go to the emergency department. In the event of inclement weather, please call our main line at [phone] for an update on the status of any delays or closures.  Dermatology Medication Tips: Please keep the boxes that topical medications come in in order to help keep track of  the instructions about where and how to use these. Pharmacies typically print the medication instructions only on the boxes and not directly on the medication tubes.   If your medication is too expensive, please contact our office at [phone] or send us  a message through MyChart.    We are unable to tell what your co-pay for medications will be in advance as this is different depending on your insurance coverage. However, we may be able to find a substitute medication at lower cost or fill out paperwork to get insurance to cover a needed medication.    If a prior authorization is required to get your medication covered by your insurance company,  please allow us  1-2 business days to complete this process.   Drug prices often vary depending on where the prescription is filled and some pharmacies may offer cheaper prices.   The website www.goodrx.com contains coupons for medications through different pharmacies. The prices here do not account for what the cost may be with help from insurance (it may be cheaper with your insurance), but the website can give you the price if you did not use any insurance.  - You can print the associated coupon and take it with your prescription to the pharmacy.  - You may also stop by our office during regular business hours and pick up a GoodRx coupon card.  - If you need your prescription sent electronically to a different pharmacy, notify our office through [facility] or by phone at [phone]

## 2023-05-16 LAB — SURGICAL PATHOLOGY

## 2023-05-20 ENCOUNTER — Ambulatory Visit: Payer: Self-pay | Admitting: Dermatology

## 2023-05-30 ENCOUNTER — Other Ambulatory Visit (HOSPITAL_BASED_OUTPATIENT_CLINIC_OR_DEPARTMENT_OTHER): Payer: Self-pay

## 2023-05-30 ENCOUNTER — Other Ambulatory Visit (HOSPITAL_COMMUNITY): Payer: Self-pay

## 2023-07-06 IMAGING — CT CT ABD-PELV W/ CM
2 of 5 series · 17 of 46 positions shown, 19 images · IV contrast (omnipaque)
Comparison: None.

CLINICAL DATA: Watery stools.  Acute abdominal pain

EXAM:
CT ABDOMEN AND PELVIS WITH CONTRAST
TECHNIQUE: Multidetector CT imaging of the abdomen and pelvis was performed
using the standard protocol following bolus administration of
intravenous contrast.
CONTRAST:  100mL OMNIPAQUE IOHEXOL 300 MG/ML  SOLN

[Series 3: a/p w/ 5mm · axial · 0.96mm/px · z∈[-568,-113]mm · 14 of 103 slices shown, 16 images]
[im 6/103  soft-tissue]
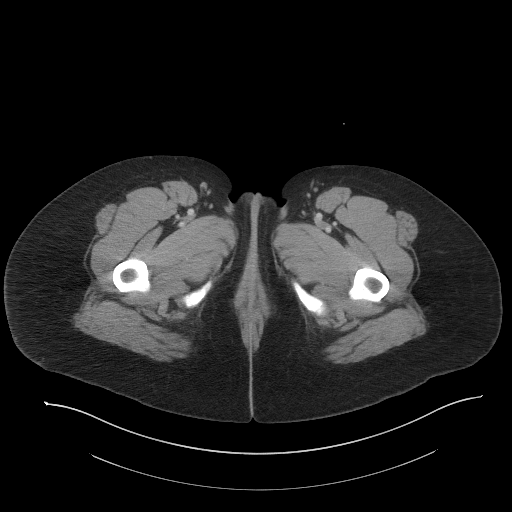
[im 6/103  bone]
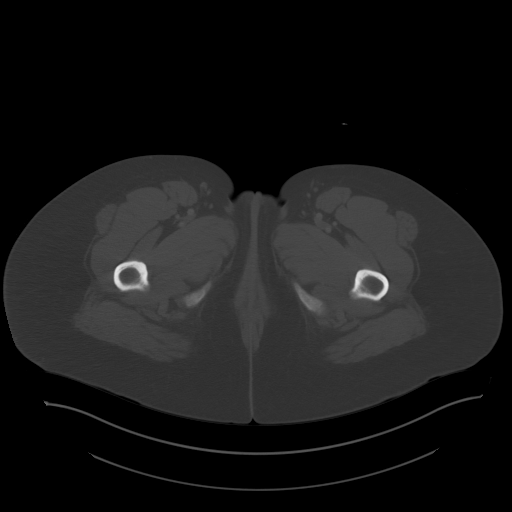
[im 11/103  soft-tissue]
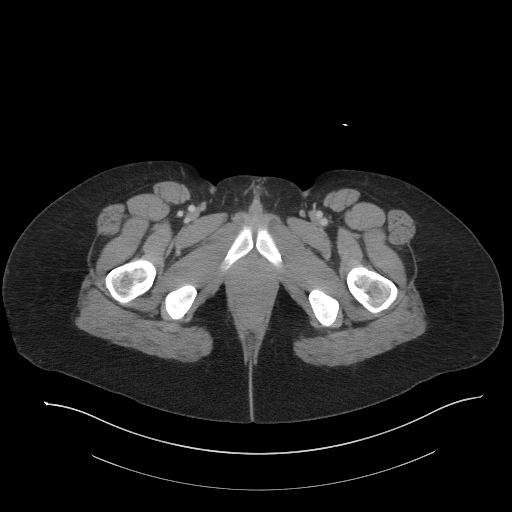
[im 22/103  soft-tissue]
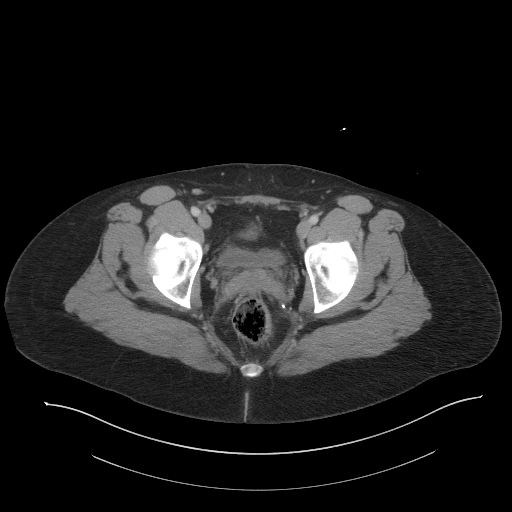
[im 27/103  soft-tissue]
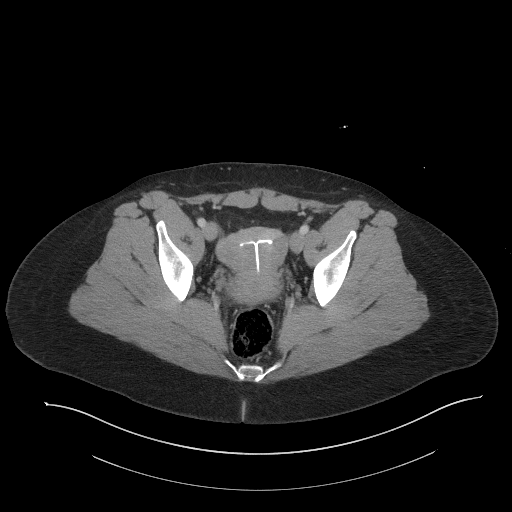
[im 33/103  soft-tissue]
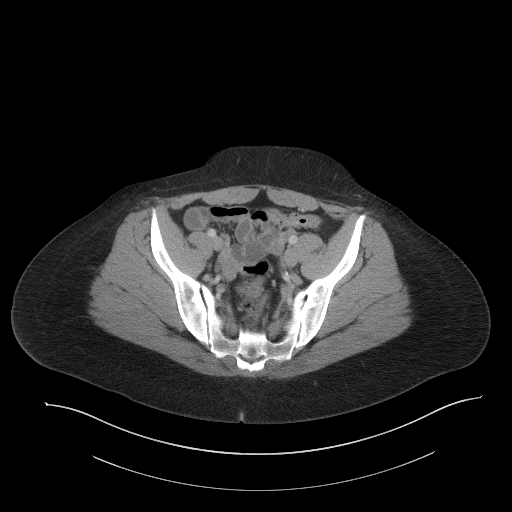
[im 43/103  soft-tissue]
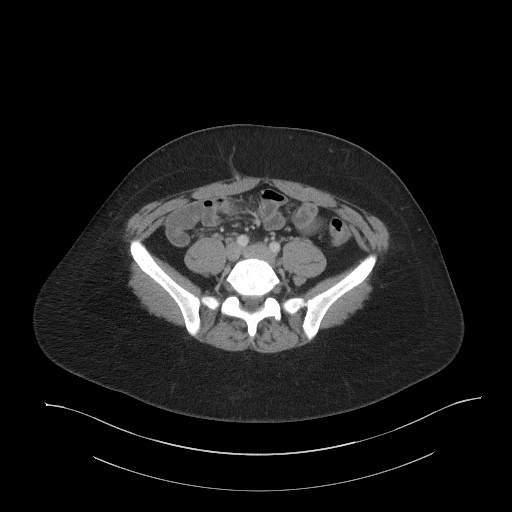
[im 49/103  soft-tissue]
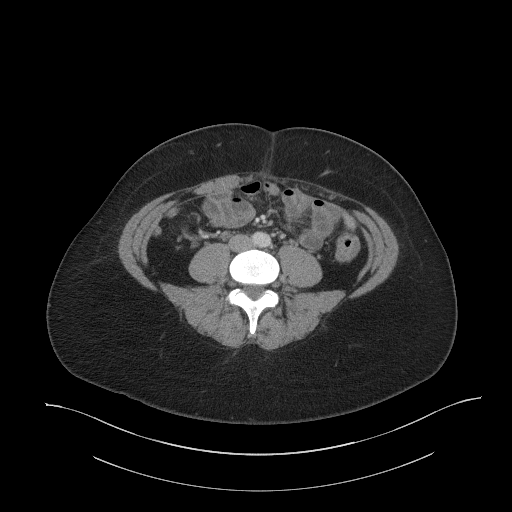
[im 54/103  soft-tissue]
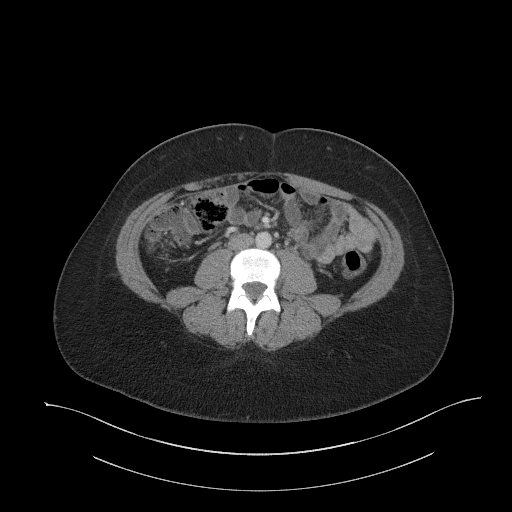
[im 60/103  soft-tissue]
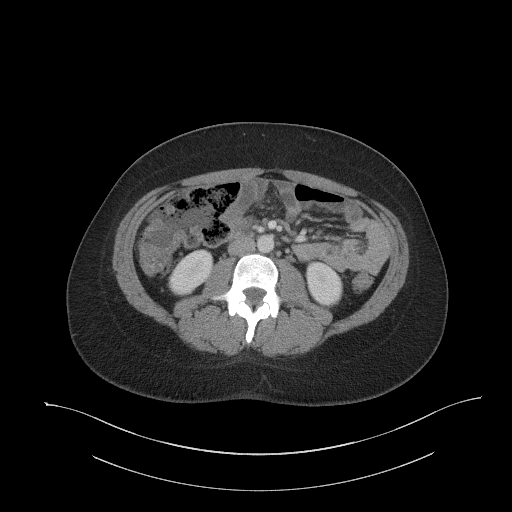
[im 60/103  bone]
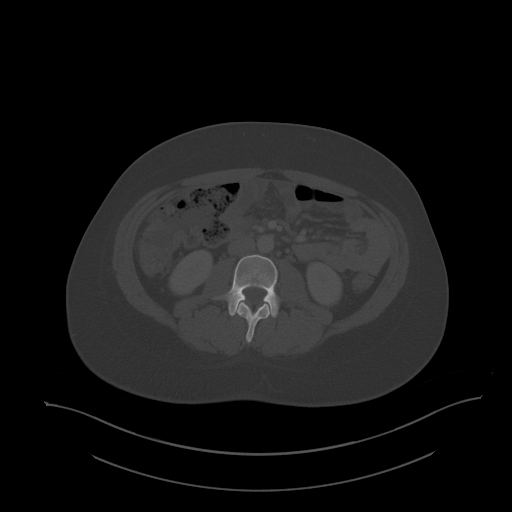
[im 70/103  soft-tissue]
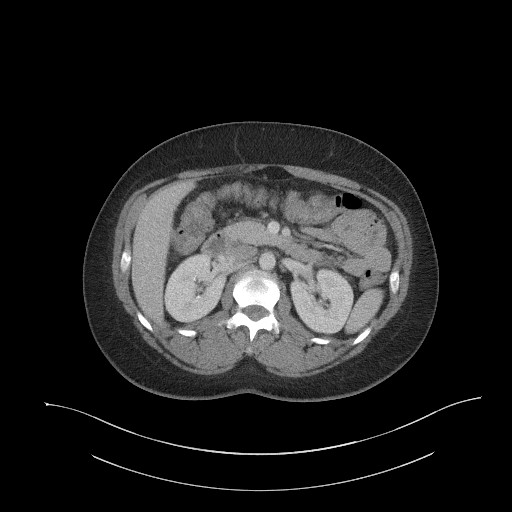
[im 76/103  soft-tissue]
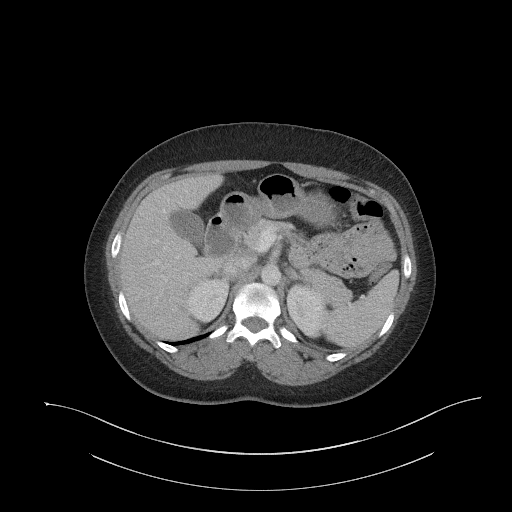
[im 81/103  soft-tissue]
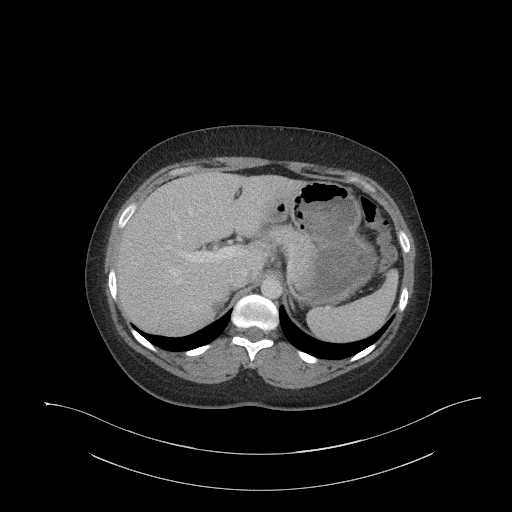
[im 92/103  soft-tissue]
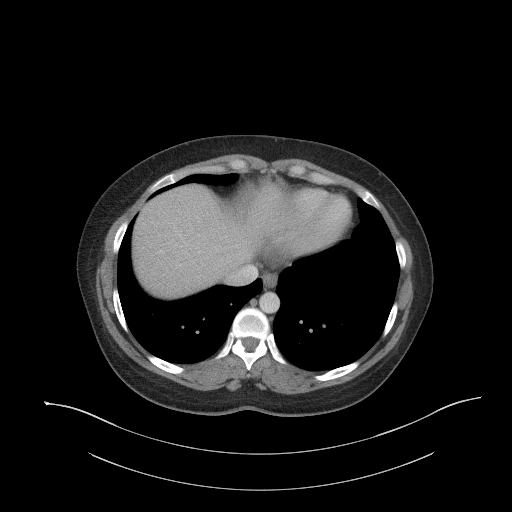
[im 97/103  soft-tissue]
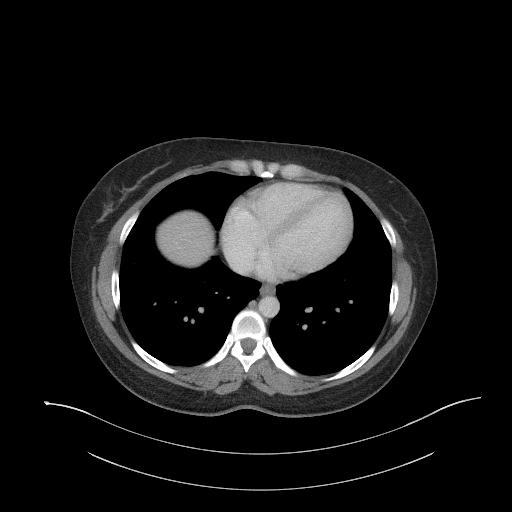

[Series 6: a/p w/ cor · coronal · 0.94mm/px · 3 of 151 slices shown]
[im 51/151  soft-tissue]
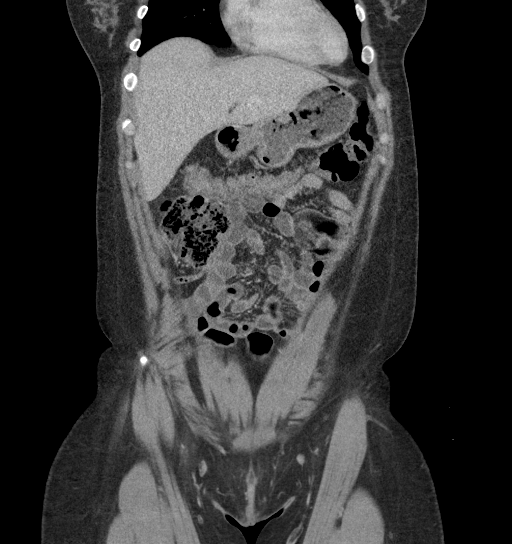
[im 67/151  soft-tissue]
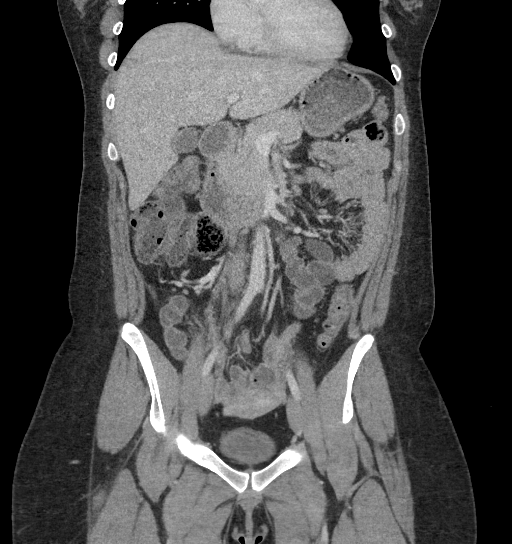
[im 84/151  soft-tissue]
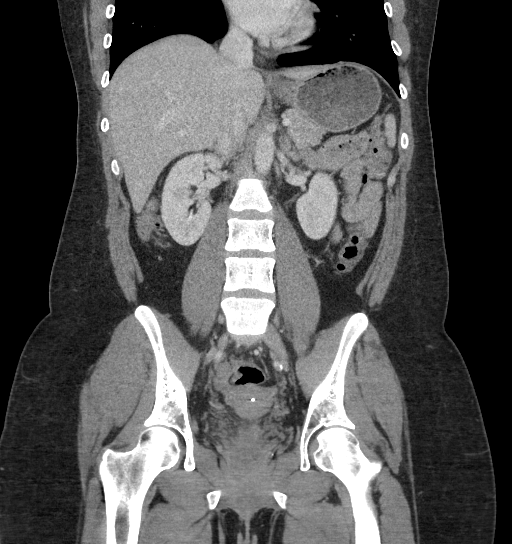

[17 of 46 positions shown; findings below may reference images not displayed]

FINDINGS: Lower chest:  No contributory findings.

Hepatobiliary: Subcentimeter low-density in the anterior right lobe
liver which is too small for densitometry.No evidence of biliary
obstruction or stone.

Pancreas: Unremarkable.

Spleen: Unremarkable.

Adrenals/Urinary Tract: 17 mm low-density right adrenal nodule
measuring -2 Hounsfield unit. No hydronephrosis or stone.
Unremarkable bladder.

Stomach/Bowel:  No obstruction. No appendicitis.

Vascular/Lymphatic: No acute vascular abnormality. No mass or
adenopathy.

Reproductive:IUD in expected position.

Other: No ascites or pneumoperitoneum.

Musculoskeletal: No acute abnormalities.
IMPRESSION: 1. No acute finding.  No visible bowel inflammation.
2. Right adrenal adenoma.

## 2023-07-19 ENCOUNTER — Other Ambulatory Visit: Payer: Self-pay

## 2023-07-19 DIAGNOSIS — M549 Dorsalgia, unspecified: Secondary | ICD-10-CM

## 2023-07-19 MED ORDER — IBUPROFEN 800 MG PO TABS: 800.0000 mg | ORAL_TABLET | Freq: Two times a day (BID) | ORAL | 3 refills | Status: AC | PRN

## 2023-07-23 ENCOUNTER — Other Ambulatory Visit: Payer: Self-pay

## 2023-07-23 ENCOUNTER — Other Ambulatory Visit: Payer: Self-pay | Admitting: Internal Medicine

## 2023-07-23 DIAGNOSIS — B353 Tinea pedis: Secondary | ICD-10-CM

## 2023-07-23 MED ORDER — CEPHALEXIN 500 MG PO CAPS: 500.0000 mg | ORAL_CAPSULE | Freq: Four times a day (QID) | ORAL | 2 refills | Status: DC

## 2023-07-23 MED ORDER — CLOTRIMAZOLE-BETAMETHASONE 1-0.05 % EX CREA: 1.0000 | TOPICAL_CREAM | Freq: Two times a day (BID) | CUTANEOUS | 8 refills | Status: DC

## 2023-08-16 ENCOUNTER — Encounter: Payer: Self-pay | Admitting: Internal Medicine

## 2023-08-16 ENCOUNTER — Ambulatory Visit (INDEPENDENT_AMBULATORY_CARE_PROVIDER_SITE_OTHER): Admitting: Internal Medicine

## 2023-08-16 VITALS — BP 122/82 | HR 78 | Temp 98.0°F | Resp 16 | Ht 67.0 in | Wt 227.0 lb

## 2023-08-16 DIAGNOSIS — H6123 Impacted cerumen, bilateral: Secondary | ICD-10-CM | POA: Diagnosis not present

## 2023-08-16 DIAGNOSIS — R270 Ataxia, unspecified: Secondary | ICD-10-CM | POA: Diagnosis not present

## 2023-08-16 DIAGNOSIS — H66001 Acute suppurative otitis media without spontaneous rupture of ear drum, right ear: Secondary | ICD-10-CM | POA: Insufficient documentation

## 2023-08-16 DIAGNOSIS — H93A1 Pulsatile tinnitus, right ear: Secondary | ICD-10-CM

## 2023-08-16 DIAGNOSIS — D510 Vitamin B12 deficiency anemia due to intrinsic factor deficiency: Secondary | ICD-10-CM

## 2023-08-16 MED ORDER — AMOXICILLIN-POT CLAVULANATE 875-125 MG PO TABS: 1.0000 | ORAL_TABLET | Freq: Two times a day (BID) | ORAL | 0 refills | Status: DC

## 2023-08-16 NOTE — Progress Notes (Signed)
 Subjective:  Patient ID: Norma White, female    DOB: 28-May-1985  Age: 38 y.o. MRN: [id number]  CC: Ear Pain (Patient had ear ach for 3 weeks )   HPI Norma White presents for acute visit ---  Discussed the use of AI scribe software for clinical note transcription with the patient, who gave verbal consent to proceed.  History of Present Illness Norma White is a 38 year old female who presents with a pulsating sensation in her right ear.  For the past three weeks, she has experienced an intermittent pulsating sensation in her right ear. The sensation varies in severity from annoying to noticeable but is not painful.   She reports occasional balance issues, feeling unsteady and 'woozy' on some days. This has occurred twice last week and once earlier this week. She describes it as feeling unsteady rather than lightheaded.  No headache, visual disturbances, slurred speech, numbness, weakness, tingling, fever, chills, sore throat, rash, swollen lymph nodes, coughing, wheezing, shortness of breath, or night sweats.  She has not used any treatments for her symptoms and has not inserted anything into her ear.    Outpatient Medications Prior to Visit  Medication Sig Dispense Refill   clobetasol  cream (TEMOVATE ) 0.05 % Apply 1 Application topically 2 (two) times daily. Apply for 2 weeks then stop 60 g 6   clotrimazole -betamethasone  (LOTRISONE ) cream Apply 1 Application topically 2 (two) times daily. 60 g 8   ibuprofen  (ADVIL ) 800 MG tablet Take 1 tablet (800 mg total) by mouth 2 (two) times daily as needed for moderate pain (pain score 4-6). 60 tablet 3   levonorgestrel  (MIRENA ) 20 MCG/DAY IUD 1 each by Intrauterine route once.     spironolactone  (ALDACTONE ) 100 MG tablet Take 1 tablet (100 mg total) by mouth daily. 30 tablet 6   Tazarotene  (ARAZLO ) 0.045 % LOTN Apply 1 Application topically at bedtime. Apply 1-2 nights weekly during the colder seasons, Starting in [facility] April increase to  3 nights weekly 45 g 2   triamcinolone  ointment (KENALOG ) 0.5 % Apply 1 Application topically 4 (four) times daily. 120 g 1   cephALEXin  (KEFLEX ) 500 MG capsule Take 1 capsule (500 mg total) by mouth 4 (four) times daily. 20 capsule 2   Multiple Vitamins-Minerals (MULTIVITAMIN GUMMIES WOMENS) CHEW qd     No facility-administered medications prior to visit.    ROS Review of Systems  Constitutional:  Negative for appetite change, chills, diaphoresis, fatigue and fever.  HENT:  Positive for ear pain, hearing loss and tinnitus. Negative for ear discharge and facial swelling.   Eyes: Negative.   Respiratory: Negative.  Negative for cough, chest tightness, shortness of breath, wheezing and stridor.   Cardiovascular:  Negative for chest pain, palpitations and leg swelling.  Gastrointestinal: Negative.  Negative for abdominal pain, constipation, diarrhea, nausea and vomiting.  Genitourinary: Negative.   Musculoskeletal:  Positive for gait problem (ataxia). Negative for myalgias.  Skin: Negative.   Neurological:  Negative for dizziness, speech difficulty, weakness, light-headedness, numbness and headaches.  Hematological:  Negative for adenopathy. Does not bruise/bleed easily.  Psychiatric/Behavioral: Negative.      Objective:  BP 122/82 (BP Location: Left Arm, Patient Position: Sitting, Cuff Size: Normal)   Pulse 78   Temp 98 F (36.7 C) (Oral)   Resp 16   Ht 5' 7 (1.702 m)   Wt 227 lb (103 kg)   SpO2 98%   BMI 35.55 kg/m   BP Readings from Last 3 Encounters:  08/16/23 122/82  09/25/22 110/70  08/02/22 130/84    Wt Readings from Last 3 Encounters:  08/16/23 227 lb (103 kg)  08/02/22 245 lb (111.1 kg)  04/18/22 233 lb (105.7 kg)    Physical Exam Vitals reviewed.  Constitutional:      Appearance: Normal appearance.  HENT:     Right Ear: Decreased hearing noted. No swelling or tenderness. No middle ear effusion. There is impacted cerumen. No foreign body. No mastoid  tenderness.     Left Ear: Decreased hearing noted. No swelling or tenderness. A middle ear effusion (serous) is present. There is impacted cerumen. No foreign body. There is mastoid tenderness.     Ears:     Comments: Patient's verbal consent obtained prior to lighted suction removal.  I used the Bionix lighted suction to remove the cerumen.  The cerumen was removed and a full view of tympanic membrane was possible after the procedure.  Patient tolerated procedure well.       Mouth/Throat:     Mouth: Mucous membranes are moist.  Eyes:     General: No scleral icterus.    Conjunctiva/sclera: Conjunctivae normal.  Cardiovascular:     Rate and Rhythm: Normal rate and regular rhythm.     Heart sounds: No murmur heard.    No friction rub. No gallop.  Pulmonary:     Effort: Pulmonary effort is normal.     Breath sounds: No stridor. No wheezing, rhonchi or rales.  Abdominal:     General: Abdomen is flat.     Palpations: There is no mass.     Tenderness: There is no abdominal tenderness. There is no guarding.     Hernia: No hernia is present.  Musculoskeletal:        General: Normal range of motion.     Cervical back: Neck supple.     Right lower leg: No edema.     Left lower leg: No edema.  Lymphadenopathy:     Cervical: No cervical adenopathy.  Skin:    General: Skin is warm and dry.  Neurological:     General: No focal deficit present.     Mental Status: She is alert. Mental status is at baseline.     Gait: Gait normal.  Psychiatric:        Mood and Affect: Mood normal.     Lab Results  Component Value Date   WBC 6.3 08/02/2022   HGB 13.5 08/02/2022   HCT 41.2 08/02/2022   PLT 312.0 08/02/2022   GLUCOSE 77 08/02/2022   CHOL 163 08/02/2022   TRIG 40.0 08/02/2022   HDL 51.90 08/02/2022   LDLCALC 103 (H) 08/02/2022   ALT 11 08/02/2022   AST 16 08/02/2022   NA 138 08/02/2022   K 3.8 08/02/2022   CL 103 08/02/2022   CREATININE 0.73 08/02/2022   BUN 13 08/02/2022    CO2 27 08/02/2022   TSH 0.60 08/02/2022   HGBA1C 5.5 07/25/2020    CT Abdomen Pelvis W Contrast Result Date: 07/10/2020 CLINICAL DATA:  Watery stools.  Acute abdominal pain EXAM: CT ABDOMEN AND PELVIS WITH CONTRAST TECHNIQUE: Multidetector CT imaging of the abdomen and pelvis was performed using the standard protocol following White administration of intravenous contrast. CONTRAST:  100mL OMNIPAQUE  IOHEXOL  300 MG/ML  SOLN COMPARISON:  None. FINDINGS: Lower chest:  No contributory findings. Hepatobiliary: Subcentimeter low-density in the anterior right lobe liver which is too small for densitometry.No evidence of biliary obstruction or stone. Pancreas: Unremarkable. Spleen: Unremarkable. Adrenals/Urinary Tract: 17  mm low-density right adrenal nodule measuring -2 Hounsfield unit. No hydronephrosis or stone. Unremarkable bladder. Stomach/Bowel:  No obstruction. No appendicitis. Vascular/Lymphatic: No acute vascular abnormality. No mass or adenopathy. Reproductive:IUD in expected position. Other: No ascites or pneumoperitoneum. Musculoskeletal: No acute abnormalities. IMPRESSION: 1. No acute finding.  No visible bowel inflammation. 2. Right adrenal adenoma. Electronically Signed   By: Cassondra Roulette M.D.   On: 07/10/2020 05:42    Assessment & Plan:  Bilateral impacted cerumen- Cerumen removed.  Non-recurrent acute suppurative otitis media of right ear without spontaneous rupture of tympanic membrane- Will evaluate for mastoiditis. -     Amoxicillin -Pot Clavulanate; Take 1 tablet by mouth 2 (two) times daily for 10 days.  Dispense: 20 tablet; Refill: 0  Ataxia- Will evaluate for CNS lesions and demyelination. -     MR ANGIO HEAD WO CONTRAST; Future -     MR BRAIN WO CONTRAST; Future  Pulsatile tinnitus of right ear- Will evaluate for aneurysm. -     MR ANGIO HEAD WO CONTRAST; Future     Follow-up: Return in about 3 months (around 11/16/2023), or if symptoms worsen or fail to improve.  Debby Molt, MD

## 2023-08-16 NOTE — Patient Instructions (Signed)

## 2023-08-17 ENCOUNTER — Encounter: Payer: Self-pay | Admitting: Internal Medicine

## 2023-08-18 ENCOUNTER — Other Ambulatory Visit: Payer: Self-pay | Admitting: Internal Medicine

## 2023-08-18 DIAGNOSIS — N912 Amenorrhea, unspecified: Secondary | ICD-10-CM

## 2023-08-19 ENCOUNTER — Other Ambulatory Visit (INDEPENDENT_AMBULATORY_CARE_PROVIDER_SITE_OTHER)

## 2023-08-19 ENCOUNTER — Ambulatory Visit: Payer: Self-pay | Admitting: Internal Medicine

## 2023-08-19 DIAGNOSIS — H93A1 Pulsatile tinnitus, right ear: Secondary | ICD-10-CM | POA: Insufficient documentation

## 2023-08-19 DIAGNOSIS — R270 Ataxia, unspecified: Secondary | ICD-10-CM

## 2023-08-19 DIAGNOSIS — D510 Vitamin B12 deficiency anemia due to intrinsic factor deficiency: Secondary | ICD-10-CM | POA: Insufficient documentation

## 2023-08-19 DIAGNOSIS — N912 Amenorrhea, unspecified: Secondary | ICD-10-CM | POA: Diagnosis not present

## 2023-08-19 LAB — CBC WITH DIFFERENTIAL/PLATELET
Basophils Absolute: 0 K/uL (ref 0.0–0.1)
Basophils Relative: 0.8 % (ref 0.0–3.0)
Eosinophils Absolute: 0 K/uL (ref 0.0–0.7)
Eosinophils Relative: 0.7 % (ref 0.0–5.0)
HCT: 40.7 % (ref 36.0–46.0)
Hemoglobin: 13.6 g/dL (ref 12.0–15.0)
Lymphocytes Relative: 25 % (ref 12.0–46.0)
Lymphs Abs: 1.3 K/uL (ref 0.7–4.0)
MCHC: 33.3 g/dL (ref 30.0–36.0)
MCV: 87.5 fl (ref 78.0–100.0)
Monocytes Absolute: 0.4 K/uL (ref 0.1–1.0)
Monocytes Relative: 7.9 % (ref 3.0–12.0)
Neutro Abs: 3.5 K/uL (ref 1.4–7.7)
Neutrophils Relative %: 65.6 % (ref 43.0–77.0)
Platelets: 251 K/uL (ref 150.0–400.0)
RBC: 4.65 Mil/uL (ref 3.87–5.11)
RDW: 14 % (ref 11.5–15.5)
WBC: 5.3 K/uL (ref 4.0–10.5)

## 2023-08-19 LAB — BASIC METABOLIC PANEL WITH GFR
BUN: 9 mg/dL (ref 6–23)
CO2: 23 meq/L (ref 19–32)
Calcium: 8.8 mg/dL (ref 8.4–10.5)
Chloride: 104 meq/L (ref 96–112)
Creatinine, Ser: 0.69 mg/dL (ref 0.40–1.20)
GFR: 111.99 mL/min (ref >60.00)
Glucose, Bld: 87 mg/dL (ref 70–99)
Potassium: 3.4 meq/L — ABNORMAL LOW (ref 3.5–5.1)
Sodium: 140 meq/L (ref 135–145)

## 2023-08-19 LAB — HCG, QUANTITATIVE, PREGNANCY: Quantitative HCG: <0.6 m[IU]/mL

## 2023-08-19 LAB — VITAMIN B12: Vitamin B-12: 179 pg/mL — ABNORMAL LOW (ref 211–911)

## 2023-08-26 ENCOUNTER — Other Ambulatory Visit (HOSPITAL_COMMUNITY): Payer: Self-pay

## 2023-08-26 ENCOUNTER — Other Ambulatory Visit: Payer: Self-pay

## 2023-08-26 ENCOUNTER — Ambulatory Visit (INDEPENDENT_AMBULATORY_CARE_PROVIDER_SITE_OTHER): Admitting: Internal Medicine

## 2023-08-26 VITALS — BP 119/80 | HR 66 | Temp 97.8°F | Ht 67.0 in | Wt 221.0 lb

## 2023-08-26 DIAGNOSIS — Z0000 Encounter for general adult medical examination without abnormal findings: Secondary | ICD-10-CM

## 2023-08-26 DIAGNOSIS — Z8249 Family history of ischemic heart disease and other diseases of the circulatory system: Secondary | ICD-10-CM | POA: Diagnosis not present

## 2023-08-26 DIAGNOSIS — E538 Deficiency of other specified B group vitamins: Secondary | ICD-10-CM | POA: Insufficient documentation

## 2023-08-26 DIAGNOSIS — H93A1 Pulsatile tinnitus, right ear: Secondary | ICD-10-CM

## 2023-08-26 DIAGNOSIS — E559 Vitamin D deficiency, unspecified: Secondary | ICD-10-CM | POA: Diagnosis not present

## 2023-08-26 MED ORDER — CYANOCOBALAMIN 1000 MCG/ML IJ SOLN
1000.0000 ug | Freq: Once | INTRAMUSCULAR | Status: AC
  Administered 2023-08-26: 1000 ug via INTRAMUSCULAR

## 2023-08-26 MED ORDER — VITAMIN D3 50 MCG (2000 UT) PO CAPS
2000.0000 [IU] | ORAL_CAPSULE | Freq: Every day | ORAL | 3 refills | Status: AC
  Filled 2023-08-26: qty 100, 100d supply, fill #0
  Filled 2023-11-24: qty 100, 100d supply, fill #1

## 2023-08-26 MED ORDER — CYANOCOBALAMIN 1000 MCG/ML IJ SOLN
INTRAMUSCULAR | 6 refills | Status: DC
  Filled 2023-08-26: qty 10, 28d supply, fill #0
  Filled 2023-11-24: qty 1, 28d supply, fill #1

## 2023-08-26 NOTE — Assessment & Plan Note (Signed)
 Head MRI/MRA tomorrow per Dr Joshua Treat B12 def Irrigate R ear again at home

## 2023-08-26 NOTE — Progress Notes (Signed)
 Subjective:  Patient ID: Norma White, female    DOB: 09-May-1985  Age: 38 y.o. MRN: [id number]  CC: Annual Exam (Referral for Cardiology due to heart beat sound increasing in rt ear to [name] at [facility])   HPI [employer] presents for a well exam  New B12 def  Outpatient Medications Prior to Visit  Medication Sig Dispense Refill   clotrimazole -betamethasone  (LOTRISONE ) cream Apply 1 Application topically 2 (two) times daily. 60 g 8   ibuprofen  (ADVIL ) 800 MG tablet Take 1 tablet (800 mg total) by mouth 2 (two) times daily as needed for moderate pain (pain score 4-6). 60 tablet 3   levonorgestrel  (MIRENA ) 20 MCG/DAY IUD 1 each by Intrauterine route once.     spironolactone  (ALDACTONE ) 100 MG tablet Take 1 tablet (100 mg total) by mouth daily. 30 tablet 6   Tazarotene  (ARAZLO ) 0.045 % LOTN Apply 1 Application topically at bedtime. Apply 1-2 nights weekly during the colder seasons, Starting in [facility] April increase to 3 nights weekly 45 g 2   triamcinolone  ointment (KENALOG ) 0.5 % Apply 1 Application topically 4 (four) times daily. 120 g 1   amoxicillin -clavulanate (AUGMENTIN ) 875-125 MG tablet Take 1 tablet by mouth 2 (two) times daily for 10 days. 20 tablet 0   clobetasol  cream (TEMOVATE ) 0.05 % Apply 1 Application topically 2 (two) times daily. Apply for 2 weeks then stop 60 g 6   No facility-administered medications prior to visit.    ROS: Review of Systems  Constitutional:  Negative for activity change, appetite change, chills, fatigue and unexpected weight change.  HENT:  Negative for congestion, mouth sores and sinus pressure.   Eyes:  Negative for visual disturbance.  Respiratory:  Negative for cough and chest tightness.   Cardiovascular:  Negative for chest pain.  Gastrointestinal:  Negative for abdominal pain, blood in stool, diarrhea, nausea and rectal pain.  Genitourinary:  Negative for difficulty urinating, frequency and vaginal pain.  Musculoskeletal:   Negative for back pain and gait problem.  Skin:  Negative for pallor and rash.  Neurological:  Positive for dizziness. Negative for tremors, weakness, light-headedness, numbness and headaches.  Psychiatric/Behavioral:  Negative for confusion and sleep disturbance.     Objective:  BP 119/80   Pulse 66   Temp 97.8 F (36.6 C) (Oral)   Ht 5' 7 (1.702 m)   Wt 221 lb (100.2 kg)   SpO2 100%   BMI 34.61 kg/m   BP Readings from Last 3 Encounters:  08/26/23 119/80  08/16/23 122/82  09/25/22 110/70    Wt Readings from Last 3 Encounters:  08/26/23 221 lb (100.2 kg)  08/16/23 227 lb (103 kg)  08/02/22 245 lb (111.1 kg)    Physical Exam Constitutional:      General: She is not in acute distress.    Appearance: Normal appearance. She is well-developed.  HENT:     Head: Normocephalic.     Right Ear: External ear normal.     Left Ear: External ear normal.     Nose: Nose normal.  Eyes:     General:        Right eye: No discharge.        Left eye: No discharge.     Conjunctiva/sclera: Conjunctivae normal.     Pupils: Pupils are equal, round, and reactive to light.  Neck:     Thyroid : No thyromegaly.     Vascular: No JVD.     Trachea: No tracheal deviation.  Cardiovascular:  Rate and Rhythm: Normal rate and regular rhythm.     Heart sounds: Normal heart sounds.  Pulmonary:     Effort: No respiratory distress.     Breath sounds: No stridor. No wheezing.  Abdominal:     General: Bowel sounds are normal. There is no distension.     Palpations: Abdomen is soft. There is no mass.     Tenderness: There is no abdominal tenderness. There is no guarding or rebound.  Musculoskeletal:        General: No tenderness.     Cervical back: Normal range of motion and neck supple. No rigidity.  Lymphadenopathy:     Cervical: No cervical adenopathy.  Skin:    Findings: No erythema or rash.  Neurological:     Cranial Nerves: No cranial nerve deficit.     Motor: No abnormal muscle tone.      Coordination: Coordination normal.     Gait: Gait normal.     Deep Tendon Reflexes: Reflexes normal.  Psychiatric:        Behavior: Behavior normal.        Thought Content: Thought content normal.        Judgment: Judgment normal.   Small wax in the R ear  Lab Results  Component Value Date   WBC 5.3 08/19/2023   HGB 13.6 08/19/2023   HCT 40.7 08/19/2023   PLT 251.0 08/19/2023   GLUCOSE 87 08/19/2023   CHOL 163 08/02/2022   TRIG 40.0 08/02/2022   HDL 51.90 08/02/2022   LDLCALC 103 (H) 08/02/2022   ALT 11 08/02/2022   AST 16 08/02/2022   NA 140 08/19/2023   K 3.4 (L) 08/19/2023   CL 104 08/19/2023   CREATININE 0.69 08/19/2023   BUN 9 08/19/2023   CO2 23 08/19/2023   TSH 0.60 08/02/2022   HGBA1C 5.5 07/25/2020    CT Abdomen Pelvis W Contrast Result Date: 07/10/2020 CLINICAL DATA:  Watery stools.  Acute abdominal pain EXAM: CT ABDOMEN AND PELVIS WITH CONTRAST TECHNIQUE: Multidetector CT imaging of the abdomen and pelvis was performed using the standard protocol following White administration of intravenous contrast. CONTRAST:  100mL OMNIPAQUE  IOHEXOL  300 MG/ML  SOLN COMPARISON:  None. FINDINGS: Lower chest:  No contributory findings. Hepatobiliary: Subcentimeter low-density in the anterior right lobe liver which is too small for densitometry.No evidence of biliary obstruction or stone. Pancreas: Unremarkable. Spleen: Unremarkable. Adrenals/Urinary Tract: 17 mm low-density right adrenal nodule measuring -2 Hounsfield unit. No hydronephrosis or stone. Unremarkable bladder. Stomach/Bowel:  No obstruction. No appendicitis. Vascular/Lymphatic: No acute vascular abnormality. No mass or adenopathy. Reproductive:IUD in expected position. Other: No ascites or pneumoperitoneum. Musculoskeletal: No acute abnormalities. IMPRESSION: 1. No acute finding.  No visible bowel inflammation. 2. Right adrenal adenoma. Electronically Signed   By: Cassondra Roulette M.D.   On: 07/10/2020 05:42     Assessment & Plan:   Problem List Items Addressed This Visit     Family history of early CAD   Mother - CAD, A fib in her 38s Appt w/Dr Raford is pending      Pulsatile tinnitus of right ear   Head MRI/MRA tomorrow per Dr Joshua Treat B12 def Irrigate R ear again at home      Vitamin B 12 deficiency   New - 2025 Start B12 inj       Relevant Orders   Vitamin B12   Vitamin D deficiency   Not taking vit D x 12 mo Re-start Rx  Relevant Orders   VITAMIN D 25 Hydroxy (Vit-D Deficiency, Fractures)   Well adult exam - Primary    We discussed age appropriate health related issues, including available/recomended screening tests and vaccinations. Labs were ordered to be later reviewed . All questions were answered. We discussed one or more of the following - seat belt use, use of sunscreen/sun exposure exercise, fall risk reduction, second hand smoke exposure, firearm use and storage, seat belt use, a need for adhering to healthy diet and exercise. Labs were ordered.  All questions were answered. GYN care/IUD per Dr Curlene       Relevant Orders   Ambulatory referral to Cardiology   CBC with Differential/Platelet   Vitamin B12   VITAMIN D 25 Hydroxy (Vit-D Deficiency, Fractures)   Urinalysis   TSH   Lipid panel      Meds ordered this encounter  Medications   Cholecalciferol  (VITAMIN D3) 50 MCG (2000 UT) capsule    Sig: Take 1 capsule (2,000 Units total) by mouth daily.    Dispense:  100 capsule    Refill:  3   cyanocobalamin  (VITAMIN B12) 1000 MCG/ML injection    Sig: Give 1000 mcg of Vitamin b12 sq daily x 1 week, then weekly for 1 month, then once every month    Dispense:  10 mL    Refill:  6      Follow-up: Return in about 3 months (around 11/26/2023) for a follow-up visit.  Marolyn Noel, MD

## 2023-08-26 NOTE — Assessment & Plan Note (Signed)
 Not taking vit D x 12 mo Re-start Rx

## 2023-08-26 NOTE — Addendum Note (Signed)
 Addended by: HEDDY IP R on: 08/26/2023 12:10 PM   Modules accepted: Orders

## 2023-08-26 NOTE — Assessment & Plan Note (Signed)
  We discussed age appropriate health related issues, including available/recomended screening tests and vaccinations. Labs were ordered to be later reviewed . All questions were answered. We discussed one or more of the following - seat belt use, use of sunscreen/sun exposure exercise, fall risk reduction, second hand smoke exposure, firearm use and storage, seat belt use, a need for adhering to healthy diet and exercise. Labs were ordered.  All questions were answered. GYN care/IUD per Dr Henderson Cloud

## 2023-08-26 NOTE — Assessment & Plan Note (Signed)
 Mother - CAD, A fib in her 67s Appt w/Dr Raford is pending

## 2023-08-26 NOTE — Assessment & Plan Note (Addendum)
 New - 2025 Start B12 inj

## 2023-08-27 ENCOUNTER — Other Ambulatory Visit

## 2023-08-27 ENCOUNTER — Ambulatory Visit
Admission: RE | Admit: 2023-08-27 | Discharge: 2023-08-27 | Disposition: A | Discharge status: Home / Self Care | Source: Ambulatory Visit | Attending: Internal Medicine | Admitting: Internal Medicine

## 2023-08-27 ENCOUNTER — Ambulatory Visit

## 2023-08-27 ENCOUNTER — Ambulatory Visit: Admission: RE | Admit: 2023-08-27 | Source: Ambulatory Visit

## 2023-08-27 DIAGNOSIS — R270 Ataxia, unspecified: Secondary | ICD-10-CM | POA: Diagnosis not present

## 2023-08-27 DIAGNOSIS — H93A1 Pulsatile tinnitus, right ear: Secondary | ICD-10-CM

## 2023-08-27 MED ORDER — GADOPICLENOL 0.5 MMOL/ML IV SOLN
10.0000 mL | Freq: Once | INTRAVENOUS | Status: AC | PRN
  Administered 2023-08-27: 10 mL via INTRAVENOUS

## 2023-09-30 ENCOUNTER — Ambulatory Visit: Admitting: Dermatology

## 2023-10-11 DIAGNOSIS — N760 Acute vaginitis: Secondary | ICD-10-CM | POA: Diagnosis not present

## 2023-10-11 DIAGNOSIS — N941 Unspecified dyspareunia: Secondary | ICD-10-CM | POA: Diagnosis not present

## 2023-10-11 DIAGNOSIS — Z113 Encounter for screening for infections with a predominantly sexual mode of transmission: Secondary | ICD-10-CM | POA: Diagnosis not present

## 2023-10-14 ENCOUNTER — Encounter (HOSPITAL_BASED_OUTPATIENT_CLINIC_OR_DEPARTMENT_OTHER): Payer: Self-pay

## 2023-10-15 ENCOUNTER — Encounter: Payer: Self-pay | Admitting: *Deleted

## 2023-10-18 ENCOUNTER — Ambulatory Visit (INDEPENDENT_AMBULATORY_CARE_PROVIDER_SITE_OTHER): Admitting: Cardiovascular Disease

## 2023-10-18 ENCOUNTER — Encounter (HOSPITAL_BASED_OUTPATIENT_CLINIC_OR_DEPARTMENT_OTHER): Payer: Self-pay | Admitting: Cardiovascular Disease

## 2023-10-18 ENCOUNTER — Other Ambulatory Visit (INDEPENDENT_AMBULATORY_CARE_PROVIDER_SITE_OTHER)

## 2023-10-18 VITALS — BP 118/80 | HR 78 | Resp 17 | Ht 67.0 in | Wt 215.0 lb

## 2023-10-18 DIAGNOSIS — Z0000 Encounter for general adult medical examination without abnormal findings: Secondary | ICD-10-CM

## 2023-10-18 DIAGNOSIS — E66811 Obesity, class 1: Secondary | ICD-10-CM | POA: Diagnosis not present

## 2023-10-18 DIAGNOSIS — Z8249 Family history of ischemic heart disease and other diseases of the circulatory system: Secondary | ICD-10-CM

## 2023-10-18 DIAGNOSIS — E559 Vitamin D deficiency, unspecified: Secondary | ICD-10-CM

## 2023-10-18 DIAGNOSIS — E538 Deficiency of other specified B group vitamins: Secondary | ICD-10-CM | POA: Diagnosis not present

## 2023-10-18 DIAGNOSIS — E010 Iodine-deficiency related diffuse (endemic) goiter: Secondary | ICD-10-CM

## 2023-10-18 LAB — CBC WITH DIFFERENTIAL/PLATELET
Basophils Absolute: 0.1 K/uL (ref 0.0–0.1)
Basophils Relative: 1 % (ref 0.0–3.0)
Eosinophils Absolute: 0 K/uL (ref 0.0–0.7)
Eosinophils Relative: 0.7 % (ref 0.0–5.0)
HCT: 42.2 % (ref 36.0–46.0)
Hemoglobin: 13.9 g/dL (ref 12.0–15.0)
Lymphocytes Relative: 33.6 % (ref 12.0–46.0)
Lymphs Abs: 1.7 K/uL (ref 0.7–4.0)
MCHC: 32.9 g/dL (ref 30.0–36.0)
MCV: 87.8 fl (ref 78.0–100.0)
Monocytes Absolute: 0.3 K/uL (ref 0.1–1.0)
Monocytes Relative: 6.6 % (ref 3.0–12.0)
Neutro Abs: 3 K/uL (ref 1.4–7.7)
Neutrophils Relative %: 58.1 % (ref 43.0–77.0)
Platelets: 277 K/uL (ref 150.0–400.0)
RBC: 4.81 Mil/uL (ref 3.87–5.11)
RDW: 13.9 % (ref 11.5–15.5)
WBC: 5.1 K/uL (ref 4.0–10.5)

## 2023-10-18 LAB — TSH: TSH: 0.46 u[IU]/mL (ref 0.35–5.50)

## 2023-10-18 LAB — LIPID PANEL
Cholesterol: 261 mg/dL — ABNORMAL HIGH (ref 0–200)
HDL: 129.4 mg/dL (ref >39.00)
LDL Cholesterol: 121 mg/dL — ABNORMAL HIGH (ref 0–99)
NonHDL: 132
Total CHOL/HDL Ratio: 2
Triglycerides: 57 mg/dL (ref 0.0–149.0)
VLDL: 11.4 mg/dL (ref 0.0–40.0)

## 2023-10-18 LAB — VITAMIN B12: Vitamin B-12: 603 pg/mL (ref 211–911)

## 2023-10-18 LAB — VITAMIN D 25 HYDROXY (VIT D DEFICIENCY, FRACTURES): VITD: 23.25 ng/mL — ABNORMAL LOW (ref 30.00–100.00)

## 2023-10-18 NOTE — Patient Instructions (Addendum)
  Medication Instructions:  Your physician recommends that you continue on your current medications as directed. Please refer to the Current Medication list given to you today.   *If you need a refill on your cardiac medications before your next appointment, please call your pharmacy*  Lab Work: [facility] WHEN YOU GET YOUR OTHER LABS   If you have labs (blood work) drawn today and your tests are completely normal, you will receive your results only by: MyChart Message (if you have MyChart) OR A paper copy in the mail If you have any lab test that is abnormal or we need to change your treatment, we will call you to review the results.  Testing/Procedures: THYROID  ULTRASOUND   Your physician has recommend you to have a coronary calcium score. This is a self pay test that will cost $99  Follow-Up: At [facility], you and your health needs are our priority.  As part of our continuing mission to provide you with exceptional heart care, our providers are all part of one team.  This team includes your primary Cardiologist (physician) and Advanced Practice Providers or APPs (Physician Assistants and Nurse Practitioners) who all work together to provide you with the care you need, when you need it.  Your next appointment:   2 TO 3 month(s)  Provider:   [name], MD, Rosaline Bane, NP, or Reche Finder, NP     We recommend signing up for the patient portal called MyChart.  Sign up information is provided on this After Visit Summary.  MyChart is used to connect with patients for Virtual Visits (Telemedicine).  Patients are able to view lab/test results, encounter notes, upcoming appointments, etc.  Non-urgent messages can be sent to your provider as well.   To learn more about what you can do with MyChart, go to [email or web address].

## 2023-10-18 NOTE — Progress Notes (Signed)
 Cardiology Office Note:  .   Date:  10/18/2023  ID:  Norma White, DOB 09/24/1985, MRN [id number] PCP: Garald Karlynn GAILS, MD  [facility] Providers Cardiologist:  None    History of Present Illness: .   Norma White is a 38 y.o. female with family history of CAD here to establish care.   Discussed the use of AI scribe software for clinical note transcription with the patient, who gave verbal consent to proceed.  History of Present Illness A 38 year old female who presents for cardiovascular risk assessment due to a significant family history of heart disease.  She is concerned about her cardiovascular risk due to a significant family history of heart disease. Her mother has experienced two heart attacks, atrial fibrillation, atrial flutter, and hypertension. Her father also has hypertension and is under the care of a cardiologist, though she is not familiar with the specifics of his condition. Additionally, her maternal uncle passed away from heart-related issues, and several other maternal relatives have had heart problems.  She has no personal history of heart disease or hypertension. She experiences being winded when climbing stairs, which has improved over time. Her heart rate can reach up to 160 bpm during physical activity, but she does not experience chest pain or pressure.  Her lifestyle includes limited exercise, primarily walking at work and caring for her four-year-old child. She has recently improved her diet, resulting in a 22-pound weight loss over the past two months by reducing snacking, sweets, and controlling portions. She mostly cooks at home and is mindful of her food choices when eating out.  She is a former social smoker, having quit in 2020, and currently consumes alcohol once or twice a month. She was previously on spironolactone  for skin issues but discontinued it due to adverse effects. She has no history of blood pressure issues.   ROS:  As per  HPI  Studies Reviewed: SABRA   EKG Interpretation Date/Time:  Friday October 18 2023 09:37:58 EDT Ventricular Rate:  73 PR Interval:  156 QRS Duration:  80 QT Interval:  384 QTC Calculation: 423 R Axis:   8  Text Interpretation: Normal sinus rhythm Normal ECG When compared with ECG of 21-Dec-2005 11:12, No significant change was found Confirmed by Raford Riggs ([id number]) on 10/18/2023 10:01:13 AM    EKG Interpretation Date/Time:  Friday October 18 2023 09:37:58 EDT Ventricular Rate:  73 PR Interval:  156 QRS Duration:  80 QT Interval:  384 QTC Calculation: 423 R Axis:   8  Text Interpretation: Normal sinus rhythm Normal ECG When compared with ECG of 21-Dec-2005 11:12, No significant change was found Confirmed by Raford Riggs ([id number]) on 10/18/2023 10:01:13 AM          Risk Assessment/Calculations:             Physical Exam:   VS:  BP 118/80 (BP Location: Right Arm, Patient Position: Sitting, Cuff Size: Normal)   Pulse 78   Resp 17   Ht 5' 7 (1.702 m)   Wt 215 lb (97.5 kg)   SpO2 98%   BMI 33.67 kg/m  , BMI Body mass index is 33.67 kg/m. GENERAL:  Well appearing HEENT: Pupils equal round and reactive, fundi not visualized, oral mucosa unremarkable NECK:  No jugular venous distention, waveform within normal limits, carotid upstroke brisk and symmetric, no bruits, + thyromegaly.  ?L nodule LUNGS:  Clear to auscultation bilaterally HEART:  RRR.  PMI not displaced or sustained,S1 and S2 within normal limits, no  S3, no S4, no clicks, no rubs, no murmurs ABD:  Flat, positive bowel sounds normal in frequency in pitch, no bruits, no rebound, no guarding, no midline pulsatile mass, no hepatomegaly, no splenomegaly EXT:  2 plus pulses throughout, no edema, no cyanosis no clubbing SKIN:  No rashes no nodules NEURO:  Cranial nerves II through XII grossly intact, motor grossly intact throughout PSYCH:  Cognitively intact, oriented to person place and time   ASSESSMENT AND  PLAN: .    Assessment & Plan Family history of ischemic heart disease and other circulatory diseases Significant family history of cardiovascular disease. Low current cardiovascular risk. CT scan showed no vascular calcification. Symptoms of increased heart rate likely due to deconditioning. Further evaluation warranted. - Order lipoprotein A (Lp(a)) test to assess genetic risk. - Recommend coronary calcium score CT scan. Inform about potential $99 cost. - Advise on lifestyle modifications: healthy diet, regular exercise, weight management. - Schedule follow-up in a couple of months to review results and reassess risk. - Repeat lipid panel as planned.  Enlarged thyroid  gland Thyroid  gland enlarged. Previous thyroid  function tests normal. No history of nodules or dysfunction. - Order thyroid  ultrasound to assess for nodules or abnormalities. - Review thyroid  function tests after current lab work.  Prior testing has been wnl.     Dispo: f/u 2-3 months  Signed, [name], MD

## 2023-10-20 ENCOUNTER — Ambulatory Visit: Payer: Self-pay | Admitting: Internal Medicine

## 2023-10-20 LAB — LIPOPROTEIN A (LPA): Lipoprotein (a): 17.1 nmol/L (ref <75.0)

## 2023-10-23 ENCOUNTER — Encounter (HOSPITAL_BASED_OUTPATIENT_CLINIC_OR_DEPARTMENT_OTHER): Payer: Self-pay | Admitting: Cardiovascular Disease

## 2023-10-23 ENCOUNTER — Ambulatory Visit: Payer: Self-pay | Admitting: Cardiovascular Disease

## 2023-10-24 ENCOUNTER — Encounter: Payer: Self-pay | Admitting: Dermatology

## 2023-10-24 ENCOUNTER — Ambulatory Visit (INDEPENDENT_AMBULATORY_CARE_PROVIDER_SITE_OTHER): Admitting: Dermatology

## 2023-10-24 VITALS — Wt 213.0 lb

## 2023-10-24 DIAGNOSIS — Z79899 Other long term (current) drug therapy: Secondary | ICD-10-CM | POA: Diagnosis not present

## 2023-10-24 DIAGNOSIS — L905 Scar conditions and fibrosis of skin: Secondary | ICD-10-CM | POA: Diagnosis not present

## 2023-10-24 DIAGNOSIS — L700 Acne vulgaris: Secondary | ICD-10-CM | POA: Diagnosis not present

## 2023-10-24 DIAGNOSIS — E7849 Other hyperlipidemia: Secondary | ICD-10-CM | POA: Diagnosis not present

## 2023-10-24 MED ORDER — ABSORICA 30 MG PO CAPS: 30.0000 mg | ORAL_CAPSULE | Freq: Every day | ORAL | 0 refills | Status: DC

## 2023-10-24 NOTE — Patient Instructions (Signed)

## 2023-10-24 NOTE — Progress Notes (Unsigned)
 Isotretinoin Follow-Up Visit   Subjective  Norma White is a 38 y.o. female who presents for the following: Isotretinoin follow-up  Week # 0  Patient present today for follow up visit for Acne. Patient was last evaluated on 05/13/23. At this visit patient was prescribed Arazlo , Spironolactone  and recommended to apply Marinda Schultze Exfoliating pads on the weekends. Patient reports sxs are unchanged. She reports she has applied Arazlo  5 nights weekly but she is still experiencing excessive oiliness.She also tried taking Spironolactone  but had adverse reaction. She is present today to start Accutane Therapy. Patient denies medication changes.  Side effects: N/A  Patient is not pregnant, not seeking pregnancy, and not breastfeeding.  The following portions of the chart were reviewed this encounter and updated as appropriate: medications, allergies, medical history  Review of Systems:  No other skin or systemic complaints except as noted in HPI or Assessment and Plan.  Objective  Well appearing patient in no apparent distress; mood and affect are within normal limits.  An examination of the face, neck, chest, and back was performed and relevant findings are noted below.            Assessment & Plan   ACNE VULGARIS Patient is currently on Isotretinoin requiring FDA mandated monthly evaluations and laboratory monitoring. Condition is currently not to goal (must reach target dose based on weight and also have clear skin for 2 months prior to discontinuation in order to help prevent relapse)  Exam findings: Mild Scarring & Inflammatory Papules  Week # 0 Pharmacy: [facility] # [id number] Total mg -  0 Total mg/kg - 0 Birth Control- Condoms and IUD  Start isotretinoin 60 mg (Two 30 mg capsules daily)  Urine pregnancy test performed in office today and was negative. Patient demonstrates comprehension and confirms she will not get pregnant.   Patient confirmed in iPledge and  isotretinoin sent to pharmacy.   Persistent acne with inflammatory nodules and mild scarring. Previous treatment with spironolactone  and Orazlo. Ready to initiate isotretinoin therapy. Emphasized the importance of taking isotretinoin with healthy fats for better absorption. Discussed potential side effects including purging, dryness, chapped lips, and mood changes. Advised on managing dryness with Aquaphor and humidifiers. Discussed alcohol consumption and advised skipping isotretinoin dose if consuming more than one drink. Emphasized the goal of reaching a cumulative dose of 200 mg/kg to reduce the chance of acne recurrence, especially in cases of hormonal acne.  - Initiate isotretinoin at 60 mg daily, taken with dinner. - Stop all current topical treatments including Orazlo and salicylic acid wash. - Use Lipcar wash for cleansing. - Monitor for purging and dryness; manage with Aquaphor and humidifiers. - Avoid alcohol consumption or skip isotretinoin dose if consuming more than one drink. - Monitor for side effects such as headaches, joint pain, and mood changes. - Aim for a cumulative dose of 200 mg/kg. - No blood donation during treatment. - Use Ciclophate serum and Vichy hyaluronic acid for skin hydration. - Send prescription to [facility] for Absorbica (isotretinoin).    Isotretinoin Counseling; Review and Contraception Counseling: Reviewed potential side effects of isotretinoin including xerosis, cheilitis, hepatitis, hyperlipidemia, and severe birth defects if taken by a pregnant woman.  Women on isotretinoin must be celibate (not having sex) or required to use at least 2 birth control methods to prevent pregnancy (unless patient is a female of non-child bearing potential).  Females of child-bearing potential must have monthly pregnancy tests while on isotretinoin and report through I-Pledge (FDA monitoring program).  Reviewed reports of suicidal ideation in those with a history of  depression while taking isotretinoin and reports of diagnosis of inflammatory bowl disease (IBD) while taking isotretinoin as well as the lack of evidence for a causal relationship between isotretinoin, depression and IBD. Patient advised to reach out with any questions or concerns. Patient advised not to share pills or donate blood while on treatment or for one month after completing treatment. All patient's considering Isotretinoin must read and understand and sign Isotretinoin Consent Form and be registered with I-Pledge.  Long term medication management (isotretinoin)  Patient is using long term (months to years) prescription medication  to control their dermatologic condition.  These medications require periodic monitoring to evaluate for efficacy and side effects and may require periodic laboratory monitoring.  - While taking Isotretinoin and for 30 days after you finish the medication, do not get pregnant, do not share pills, do not donate blood. Isotretinoin is best absorbed when taken with a fatty meal. Isotretinoin can make you sensitive to the sun. Daily careful sun protection including sunscreen SPF 30+ when outdoors is recommended.  Hyperlipidemia Elevated cholesterol levels, likely influenced by genetic factors. Recent lab work showed high LDL and cholesterol levels. Discussed the impact of non-fasting on triglyceride levels and the need for re-evaluation. Recommended fish oil supplements for overall health and cholesterol management. No immediate need for statin therapy. - Recheck cholesterol levels in two months. - Recommend fish oil supplements for cholesterol management.   HIGH RISK MEDICATION USE   Related Procedures Lipid Panel ACNE VULGARIS   Related Medications Tazarotene  (ARAZLO ) 0.045 % LOTN Apply 1 Application topically at bedtime. Apply 1-2 nights weekly during the colder seasons, Starting in [facility] April increase to 3 nights weekly ABSORICA 30 MG capsule Take 1 capsule  (30 mg total) by mouth daily.  Follow-up in 30 days  I, Jetta Ager, am acting as Neurosurgeon for [employer], DO.  Documentation: I have reviewed the above documentation for accuracy and completeness, and I agree with the above.  Delon Lenis, DO

## 2023-10-28 ENCOUNTER — Other Ambulatory Visit: Payer: Self-pay

## 2023-10-28 DIAGNOSIS — L700 Acne vulgaris: Secondary | ICD-10-CM

## 2023-10-28 MED ORDER — ISOTRETINOIN 30 MG PO CAPS: 30.0000 mg | ORAL_CAPSULE | Freq: Every day | ORAL | 0 refills | Status: DC

## 2023-11-03 ENCOUNTER — Encounter (HOSPITAL_BASED_OUTPATIENT_CLINIC_OR_DEPARTMENT_OTHER): Payer: Self-pay | Admitting: Cardiovascular Disease

## 2023-11-08 ENCOUNTER — Ambulatory Visit (HOSPITAL_BASED_OUTPATIENT_CLINIC_OR_DEPARTMENT_OTHER)

## 2023-11-08 ENCOUNTER — Other Ambulatory Visit (HOSPITAL_BASED_OUTPATIENT_CLINIC_OR_DEPARTMENT_OTHER)

## 2023-11-14 ENCOUNTER — Other Ambulatory Visit: Payer: Self-pay | Admitting: Internal Medicine

## 2023-11-14 DIAGNOSIS — E785 Hyperlipidemia, unspecified: Secondary | ICD-10-CM

## 2023-11-15 ENCOUNTER — Other Ambulatory Visit (INDEPENDENT_AMBULATORY_CARE_PROVIDER_SITE_OTHER)

## 2023-11-15 DIAGNOSIS — E785 Hyperlipidemia, unspecified: Secondary | ICD-10-CM

## 2023-11-15 LAB — LIPID PANEL
Cholesterol: 192 mg/dL (ref 0–200)
HDL: 52.4 mg/dL (ref >39.00)
LDL Cholesterol: 131 mg/dL — ABNORMAL HIGH (ref 0–99)
NonHDL: 139.9
Total CHOL/HDL Ratio: 4
Triglycerides: 44 mg/dL (ref 0.0–149.0)
VLDL: 8.8 mg/dL (ref 0.0–40.0)

## 2023-11-18 ENCOUNTER — Ambulatory Visit: Payer: Self-pay | Admitting: Internal Medicine

## 2023-11-20 ENCOUNTER — Ambulatory Visit (INDEPENDENT_AMBULATORY_CARE_PROVIDER_SITE_OTHER): Admitting: Physician Assistant

## 2023-11-20 ENCOUNTER — Encounter: Payer: Self-pay | Admitting: Physician Assistant

## 2023-11-20 VITALS — Wt 213.0 lb

## 2023-11-20 DIAGNOSIS — Z7189 Other specified counseling: Secondary | ICD-10-CM

## 2023-11-20 DIAGNOSIS — L700 Acne vulgaris: Secondary | ICD-10-CM | POA: Diagnosis not present

## 2023-11-20 DIAGNOSIS — L853 Xerosis cutis: Secondary | ICD-10-CM

## 2023-11-20 DIAGNOSIS — Z79899 Other long term (current) drug therapy: Secondary | ICD-10-CM

## 2023-11-20 DIAGNOSIS — K130 Diseases of lips: Secondary | ICD-10-CM

## 2023-11-20 MED ORDER — ISOTRETINOIN 30 MG PO CAPS: 60.0000 mg | ORAL_CAPSULE | Freq: Every day | ORAL | 0 refills | Status: DC

## 2023-11-20 NOTE — Progress Notes (Signed)
 Isotretinoin Follow-Up Visit   Subjective  Norma White is a 38 y.o. female who presents for the following: Isotretinoin follow-up  Week # 4    Isotretinoin F/U - 11/20/23 1600       Isotretinoin Follow Up   iPledge # [id number]      Skin Side Effects   Dry Lips Yes    Nose bleeds No    Dry eyes No    Dry Skin Yes    Sunburn No      Gastrointestinal Side Effects   Nausea No    Diarrhea No    Blood in stool No      Neurological Side Effects   Blurred vision No    Depression No    Headache No    Homicidal thoughts No    Mood Changes No    Suicidal thoughts No      Constitutional Side Effects   Fatigue No      Musculoskeletal Side Effects   Muscle aches No           Side effects: Dry skin, dry lips  Patient is not pregnant, not seeking pregnancy, and not breastfeeding.   The following portions of the chart were reviewed this encounter and updated as appropriate: medications, allergies, medical history  Review of Systems:  No other skin or systemic complaints except as noted in HPI or Assessment and Plan.  Objective  Well appearing patient in no apparent distress; mood and affect are within normal limits.  An examination of the face, neck, chest, and back was performed and relevant findings are noted below.     Assessment & Plan   ACNE VULGARIS   Related Medications ISOtretinoin (ABSORICA) 30 MG capsule Take 1 capsule (30 mg total) by mouth daily. HIGH RISK MEDICATION USE   CHEILITIS   XEROSIS CUTIS    ACNE VULGARIS Patient is currently on Isotretinoin requiring FDA mandated monthly evaluations and laboratory monitoring. Condition is currently not to goal (must reach target dose based on weight and also have clear skin for 2 months prior to discontinuation in order to help prevent relapse)  Exam findings: Scars, post-inflammatory hyperpigmentation and resolving papules and pustules   Week # 4 Pharmacy [facility] #  [id number] Total mg -  1800 Total mg/kg - 18.63 Birth Control- IUD/Condoms  Continue isotretinoin at 60 mg daily.   Urine pregnancy test performed in office today and was negative.  Patient demonstrates comprehension and confirms she will not get pregnant.   Patient confirmed in iPledge and isotretinoin sent to pharmacy.   Isotretinoin Counseling; Review and Contraception Counseling: Reviewed potential side effects of isotretinoin including xerosis, cheilitis, hepatitis, hyperlipidemia, and severe birth defects if taken by a pregnant woman.  Women on isotretinoin must be celibate (not having sex) or required to use at least 2 birth control methods to prevent pregnancy (unless patient is a female of non-child bearing potential).  Females of child-bearing potential must have monthly pregnancy tests while on isotretinoin and report through I-Pledge (FDA monitoring program). Reviewed reports of suicidal ideation in those with a history of depression while taking isotretinoin and reports of diagnosis of inflammatory bowl disease (IBD) while taking isotretinoin as well as the lack of evidence for a causal relationship between isotretinoin, depression and IBD. Patient advised to reach out with any questions or concerns. Patient advised not to share pills or donate blood while on treatment or for one month after completing treatment. All patient's considering Isotretinoin must read and understand  and sign Isotretinoin Consent Form and be registered with I-Pledge.  Xerosis secondary to isotretinoin therapy - Continue emollients as directed  Cheilitis secondary to isotretinoin therapy - Continue lip balm as directed   Long term medication management (isotretinoin).  Patient is using long term (months to years) prescription medication  to control their dermatologic condition.  These medications require periodic monitoring to evaluate for efficacy and side effects and may require periodic laboratory  monitoring.  - While taking Isotretinoin and for 30 days after you finish the medication, do not get pregnant, do not share pills, do not donate blood. Isotretinoin is best absorbed when taken with a fatty meal. Isotretinoin can make you sensitive to the sun. Daily careful sun protection including sunscreen SPF 30+ when outdoors is recommended.  Follow-up in 30 days.  I, Roseline Hutchinson, CMA, am acting as scribe for Isrrael Fluckiger K, PA-C .   Documentation: I have reviewed the above documentation for accuracy and completeness, and I agree with the above.  Jemell Town K, PA-C

## 2023-11-21 ENCOUNTER — Ambulatory Visit: Admitting: Dermatology

## 2023-11-25 ENCOUNTER — Other Ambulatory Visit (HOSPITAL_COMMUNITY): Payer: Self-pay

## 2023-11-26 ENCOUNTER — Encounter: Payer: Self-pay | Admitting: Pharmacist

## 2023-11-26 ENCOUNTER — Other Ambulatory Visit: Payer: Self-pay

## 2023-12-18 ENCOUNTER — Encounter (HOSPITAL_BASED_OUTPATIENT_CLINIC_OR_DEPARTMENT_OTHER): Payer: Self-pay

## 2023-12-23 ENCOUNTER — Encounter: Payer: Self-pay | Admitting: Physician Assistant

## 2023-12-23 ENCOUNTER — Other Ambulatory Visit (HOSPITAL_COMMUNITY): Payer: Self-pay

## 2023-12-23 ENCOUNTER — Ambulatory Visit: Admitting: Physician Assistant

## 2023-12-23 VITALS — BP 125/84 | Wt 205.0 lb

## 2023-12-23 DIAGNOSIS — Z79899 Other long term (current) drug therapy: Secondary | ICD-10-CM

## 2023-12-23 DIAGNOSIS — L700 Acne vulgaris: Secondary | ICD-10-CM

## 2023-12-23 DIAGNOSIS — K130 Diseases of lips: Secondary | ICD-10-CM

## 2023-12-23 DIAGNOSIS — L905 Scar conditions and fibrosis of skin: Secondary | ICD-10-CM

## 2023-12-23 MED ORDER — ISOTRETINOIN 40 MG PO CAPS: 40.0000 mg | ORAL_CAPSULE | Freq: Two times a day (BID) | ORAL | 0 refills | Status: AC

## 2023-12-23 MED ORDER — HYDROCORTISONE 2.5 % EX OINT
TOPICAL_OINTMENT | Freq: Two times a day (BID) | CUTANEOUS | 1 refills | Status: DC
  Filled 2023-12-23: qty 40, 30d supply, fill #0

## 2023-12-23 NOTE — Progress Notes (Signed)
 "   Isotretinoin  Follow-Up Visit   Subjective  Norma White is a 38 y.o. female ESTABLISHED PATIENT who presents for the following: Isotretinoin  follow-up  Week # 8   Isotretinoin  F/U - 12/23/23 1500       Isotretinoin  Follow Up   iPledge # [id number]    Date 12/23/23    Weight 205 lb (93 kg)    Two Forms of Birth Control IUD;Female Condom    Acne breakouts since last visit? Yes      Dosage   Target Dosage (mg) 14490    Current (To Date) Dosage (mg) 3600    To Go Dosage (mg) 10890      Skin Side Effects   Dry Lips Yes    Nose bleeds No    Dry eyes No    Dry Skin Yes    Sunburn No      Gastrointestinal Side Effects   Nausea No    Diarrhea No    Blood in stool No      Neurological Side Effects   Blurred vision No    Depression No    Headache No    Homicidal thoughts No    Mood Changes No    Suicidal thoughts No      Constitutional Side Effects   Fatigue No      Musculoskeletal Side Effects   Muscle aches Yes            Side effects: Dry skin, dry lips  The following portions of the chart were reviewed this encounter and updated as appropriate: medications, allergies, medical history  Review of Systems:  No other skin or systemic complaints except as noted in HPI or Assessment and Plan.  Objective  Well appearing patient in no apparent distress; mood and affect are within normal limits.  An examination of the face, neck, chest, and back was performed and relevant findings are noted below.           Assessment & Plan   ACNE VULGARIS   ACNE SCARRING   CHEILITIS   HIGH RISK MEDICATION USE    ACNE VULGARIS Patient is currently on Isotretinoin  requiring FDA mandated monthly evaluations and laboratory monitoring. Condition is currently not to goal (must reach target dose based on weight and also have clear skin for 2 months prior to discontinuation in order to help prevent relapse)  Exam findings: Scars, resolving papules/pustules  and post-inflammatory hyperpigmentation   Week # 8 Pharmacy [facility] # [id number] Total mg -  3600 Total mg/kg - 38.6  Urine pregnancy test performed in office today and was negative.  Patient demonstrates comprehension and confirms she will not get pregnant.    Increase isotretinoin  40 mg 2 capsules daily  Patient confirmed in iPledge and isotretinoin  sent to pharmacy.    Xerosis secondary to isotretinoin  therapy - Continue emollients as directed   Cheilitis secondary to isotretinoin  therapy - Continue lip balm as directed,   Long term medication management (isotretinoin ).  Patient is using long term (months to years) prescription medication  to control their dermatologic condition.  These medications require periodic monitoring to evaluate for efficacy and side effects and may require periodic laboratory monitoring.  - While taking Isotretinoin  and for 30 days after you finish the medication, do not share pills, do not donate blood. It is very important that a women who could become pregnant not take this medicine or get a blood transfusion with this medicine in it. Isotretinoin  is best absorbed when  taken with a fatty meal. Isotretinoin  can make you sensitive to the sun. Daily careful sun protection including sunscreen SPF 30+ when outdoors is recommended.  Follow-up in 30 days.  I, Roseline Hutchinson, CMA, am acting as scribe for Jannae Fagerstrom K, PA-C .   Documentation: I have reviewed the above documentation for accuracy and completeness, and I agree with the above.  Jacelynn Hayton K, PA-C "

## 2024-01-03 ENCOUNTER — Other Ambulatory Visit (HOSPITAL_COMMUNITY): Payer: Self-pay

## 2024-01-10 ENCOUNTER — Ambulatory Visit: Admitting: Family Medicine

## 2024-01-13 ENCOUNTER — Other Ambulatory Visit: Payer: Self-pay | Admitting: Internal Medicine

## 2024-01-13 MED ORDER — CEPHALEXIN 500 MG PO CAPS
500.0000 mg | ORAL_CAPSULE | Freq: Four times a day (QID) | ORAL | 3 refills | Status: AC
  Filled 2024-01-13: qty 16, 4d supply, fill #0

## 2024-01-13 NOTE — Progress Notes (Signed)
 Relapsing cystitis

## 2024-01-14 ENCOUNTER — Other Ambulatory Visit (HOSPITAL_COMMUNITY): Payer: Self-pay

## 2024-01-14 ENCOUNTER — Other Ambulatory Visit: Payer: Self-pay

## 2024-01-14 ENCOUNTER — Other Ambulatory Visit: Payer: Self-pay | Admitting: Internal Medicine

## 2024-01-14 DIAGNOSIS — K580 Irritable bowel syndrome with diarrhea: Secondary | ICD-10-CM

## 2024-01-14 MED ORDER — METRONIDAZOLE 500 MG PO TABS
500.0000 mg | ORAL_TABLET | Freq: Three times a day (TID) | ORAL | 3 refills | Status: AC
  Filled 2024-01-14: qty 30, 10d supply, fill #0

## 2024-01-14 MED ORDER — METRONIDAZOLE 500 MG PO TABS
500.0000 mg | ORAL_TABLET | Freq: Three times a day (TID) | ORAL | 3 refills | Status: DC
  Filled 2024-01-14: qty 30, 10d supply, fill #0

## 2024-01-15 ENCOUNTER — Other Ambulatory Visit (HOSPITAL_COMMUNITY): Payer: Self-pay

## 2024-01-15 ENCOUNTER — Other Ambulatory Visit: Payer: Self-pay

## 2024-01-23 ENCOUNTER — Ambulatory Visit: Admitting: Dermatology

## 2024-01-23 VITALS — Wt 205.0 lb

## 2024-01-23 DIAGNOSIS — Z79899 Other long term (current) drug therapy: Secondary | ICD-10-CM

## 2024-01-23 DIAGNOSIS — L853 Xerosis cutis: Secondary | ICD-10-CM

## 2024-01-23 DIAGNOSIS — L905 Scar conditions and fibrosis of skin: Secondary | ICD-10-CM

## 2024-01-23 DIAGNOSIS — L700 Acne vulgaris: Secondary | ICD-10-CM | POA: Diagnosis not present

## 2024-01-23 DIAGNOSIS — B353 Tinea pedis: Secondary | ICD-10-CM | POA: Diagnosis not present

## 2024-01-23 MED ORDER — ISOTRETINOIN 40 MG PO CAPS: 40.0000 mg | ORAL_CAPSULE | Freq: Two times a day (BID) | ORAL | 0 refills | Status: AC

## 2024-01-23 NOTE — Progress Notes (Signed)
 "  Isotretinoin  Follow-Up Visit   Subjective  Norma White is a 39 y.o. female who presents for the following: Isotretinoin  follow-up  Week # 12, Current dose 80 mg Daily   Isotretinoin  F/U - 01/23/24 1600       Isotretinoin  Follow Up   iPledge # [id number]    Date 01/23/24    Weight 205 lb (93 kg)    Two Forms of Birth Control IUD;Female Condom    Acne breakouts since last visit? Yes      Dosage   Target Dosage (mg) 14490    Current (To Date) Dosage (mg) 6000    To Go Dosage (mg) 8490      Skin Side Effects   Dry Lips Yes    Nose bleeds No    Dry eyes No    Dry Skin Yes    Sunburn No      Gastrointestinal Side Effects   Nausea No    Diarrhea No    Blood in stool No      Neurological Side Effects   Blurred vision No    Depression No    Headache No    Homicidal thoughts No    Mood Changes No    Suicidal thoughts No      Constitutional Side Effects   Fatigue No      Musculoskeletal Side Effects   Muscle aches No      Other Side Effects   Other Side Effects N/A         Side effects: Dry skin, dry lips  Patient is not pregnant, not seeking pregnancy, and not breastfeeding.  The following portions of the chart were reviewed this encounter and updated as appropriate: medications, allergies, medical history  Review of Systems:  No other skin or systemic complaints except as noted in HPI or Assessment and Plan.  Objective  Well appearing patient in no apparent distress; mood and affect are within normal limits.  An examination of the face, neck, chest, and back was performed and relevant findings are noted below.          Assessment & Plan   ACNE VULGARIS Patient is currently on Isotretinoin  requiring FDA mandated monthly evaluations and laboratory monitoring. Condition is currently not to goal (must reach target dose based on weight and also have clear skin for 2 months prior to discontinuation in order to help prevent relapse)  Exam  findings: Dry lips and Dry Skin  Week # 12 Pharmacy [facility] # [id number] Total mg -  6000 Total mg/kg - 65 mg/kg Birth Control- IUD and Female Condoms  Acne vulgaris is improving with current treatment, though not completely resolved. Few inflammatory papules and some hyperpigmented papules remain. No irritation from current topical treatments. Skin hydration is adequate with no significant dryness. Current isotretinoin  dose is 80 mg, with plans to increase to 200 mg/kg. Discussed potential for atrophy with injections and advised against it. Discussed use of benzoyl peroxide  and pimple patches for spot treatment. - Continue isotretinoin  80 mg daily. - Use benzoyl peroxide  for spot treatment in the morning. - Apply pimple patches at night. - Maintain current skincare routine with moisturizers and sunscreens. - Monitor isotretinoin  intake and track missed doses for future adjustments.   Urine pregnancy test performed in office today and was negative.  Patient demonstrates comprehension and confirms she will not get pregnant.  Patient confirmed in iPledge and isotretinoin  sent to pharmacy.   Isotretinoin  Counseling; Review and Contraception Counseling: Reviewed potential side  effects of isotretinoin  including xerosis, cheilitis, hepatitis, hyperlipidemia, and severe birth defects if taken by a pregnant woman.  Women on isotretinoin  must be celibate (not having sex) or required to use at least 2 birth control methods to prevent pregnancy (unless patient is a female of non-child bearing potential).  Females of child-bearing potential must have monthly pregnancy tests while on isotretinoin  and report through I-Pledge (FDA monitoring program). Reviewed reports of suicidal ideation in those with a history of depression while taking isotretinoin  and reports of diagnosis of inflammatory bowl disease (IBD) while taking isotretinoin  as well as the lack of evidence for a causal relationship  between isotretinoin , depression and IBD. Patient advised to reach out with any questions or concerns. Patient advised not to share pills or donate blood while on treatment or for one month after completing treatment. All patient's considering Isotretinoin  must read and understand and sign Isotretinoin  Consent Form and be registered with I-Pledge.  Xerosis secondary to isotretinoin  therapy - Continue emollients as directed   Cheilitis secondary to isotretinoin  therapy - Continue lip balm as directed, Aquaphor Body Balm recommended  Long term medication management (isotretinoin )  Patient is using long term (months to years) prescription medication  to control their dermatologic condition.  These medications require periodic monitoring to evaluate for efficacy and side effects and may require periodic laboratory monitoring.  - While taking Isotretinoin  and for 30 days after you finish the medication, do not get pregnant, do not share pills, do not donate blood. Isotretinoin  is best absorbed when taken with a fatty meal. Isotretinoin  can make you sensitive to the sun. Daily careful sun protection including sunscreen SPF 30+ when outdoors is recommended.  Tinea pedis Persistent peeling on the toe despite previous treatment with clotrimazole  and betamethasone . No itching currently, but peeling persists. Previous treatment was for four weeks with no atrophy noted. Discussed the importance of maintaining hygiene and using appropriate antifungal treatments. - Continue clotrimazole  and betamethasone  for another four weeks. - Use Tylex in showers to prevent recurrence. - Maintain hygiene practices to prevent reinfection. ACNE VULGARIS   This Visit - ISOtretinoin  (ACCUTANE ) 40 MG capsule - Take 1 capsule (40 mg total) by mouth 2 (two) times daily. ACNE SCARRING   This Visit - ISOtretinoin  (ACCUTANE ) 40 MG capsule - Take 1 capsule (40 mg total) by mouth 2 (two) times daily.  Follow-up in 30  days.  I, Jetta Ager, am acting as neurosurgeon for [employer], DO.  Documentation: I have reviewed the above documentation for accuracy and completeness, and I agree with the above.  Delon Lenis, DO  "

## 2024-01-24 ENCOUNTER — Ambulatory Visit: Admitting: Family Medicine

## 2024-01-24 ENCOUNTER — Ambulatory Visit

## 2024-01-24 ENCOUNTER — Encounter: Payer: Self-pay | Admitting: Family Medicine

## 2024-01-24 ENCOUNTER — Other Ambulatory Visit: Payer: Self-pay

## 2024-01-24 VITALS — BP 120/82 | HR 72 | Ht 67.0 in | Wt 199.0 lb

## 2024-01-24 DIAGNOSIS — M25512 Pain in left shoulder: Secondary | ICD-10-CM

## 2024-01-24 DIAGNOSIS — G8929 Other chronic pain: Secondary | ICD-10-CM

## 2024-01-24 NOTE — Progress Notes (Signed)
"       ° °  I, Leotis Batter, CMA acting as a scribe for Artist Lloyd, MD.  Norma White is a 39 y.o. female who presents to [facility] today for L shoulder pain. Pt was previously seen by Dr. Lloyd on 12/14/21 for L foot pain.  Today, pt c/o L shoulder pain x several years, worsening over the past 3 months. FNP:izwpzd injury. Pt locates pain to anterior aspect of the shoulder. Aching pain with repetitive motions and side-lying. Denies n/t/w or decreased grip strength.   Radiates: localized Aggravates: lateral and overhead reaching Treatments tried: Aleve, IBU  Pertinent review of systems: No fevers or chills  Relevant historical information: IBS   Exam:  BP 120/82   Pulse 72   Ht 5' 7 (1.702 m)   Wt 199 lb (90.3 kg)   SpO2 98%   BMI 31.17 kg/m  General: Well Developed, well nourished, and in no acute distress.   MSK: Left shoulder normal appearing normal motion.  Some pain present with crossover arm compression test. Intact strength.  Negative empty can testing.  Minimally positive Hawkins and Neer's testing.    Lab and Radiology Results  [facility] MSK Ultrasound of: Left shoulder Biceps tendon is intact. Subscapularis tendon is intact and normal. Supraspinatus tendon possible nonretracted tear versus bursitis. Infraspinatus tendon normal. AC joint no effusion nontender. Impression: Subacromial bursitis  X-ray images left shoulder obtained today personally and independently interpreted No acute fractures no significant degenerative changes. Await formal radiology review   Assessment and Plan: 39 y.o. female with left shoulder pain due to subacromial impingement.  Crossover arm compression test being positive would indicate AC joint is the source of pain but that joint looks normal on physical exam and ultrasound and is nontender.  Plan for physical therapy and Voltaren gel.  Consider injection if needed.  Check back if not  better.   PDMP not reviewed this encounter. Orders Placed This Encounter  Procedures   US  LIMITED JOINT SPACE STRUCTURES UP LEFT(NO LINKED CHARGES)    Reason for Exam (SYMPTOM  OR DIAGNOSIS REQUIRED):   left shoulder pain    Preferred imaging location?:   [facility] Shoulder Left    Standing Status:   Future    Number of Occurrences:   1    Expiration Date:   02/24/2024    Reason for Exam (SYMPTOM  OR DIAGNOSIS REQUIRED):   left shoulder pain    Preferred imaging location?:   [facility]    Is patient pregnant?:   No   Ambulatory referral to Physical Therapy    Referral Priority:   Routine    Referral Type:   Physical Medicine    Referral Reason:   Specialty Services Required    Requested Specialty:   Physical Therapy    Number of Visits Requested:   1   No orders of the defined types were placed in this encounter.    Discussed warning signs or symptoms. Please see discharge instructions. Patient expresses understanding.   The above documentation has been reviewed and is accurate and complete Artist Lloyd, M.D.   "

## 2024-01-24 NOTE — Patient Instructions (Signed)
 Thank you for coming in today.   Please get an Xray today before you leave   A referral for physical therapy has been submitted. A representative from the physical therapy office will contact you to coordinate scheduling after confirming your benefits with your insurance provider. If you do not hear from the physical therapy office within the next 1-2 weeks, please let us  know.   See you back if symptoms don't improve.

## 2024-01-27 ENCOUNTER — Encounter: Payer: Self-pay | Admitting: Dermatology

## 2024-01-28 ENCOUNTER — Ambulatory Visit: Payer: Self-pay | Admitting: Family Medicine

## 2024-01-28 NOTE — Progress Notes (Signed)
Left shoulder x-ray looks okay to radiology.

## 2024-02-05 ENCOUNTER — Other Ambulatory Visit: Payer: Self-pay

## 2024-02-05 MED ORDER — FLUCONAZOLE 150 MG PO TABS
150.0000 mg | ORAL_TABLET | ORAL | 0 refills | Status: AC
  Filled 2024-02-05: qty 2, 6d supply, fill #0

## 2024-02-06 ENCOUNTER — Encounter (HOSPITAL_BASED_OUTPATIENT_CLINIC_OR_DEPARTMENT_OTHER): Payer: Self-pay | Admitting: Cardiovascular Disease

## 2024-02-06 NOTE — Telephone Encounter (Signed)
 Norma White, looks like referral was for PT here. Pt would like to be contacted via MyChart about scheduling.   Thank you!

## 2024-02-24 ENCOUNTER — Ambulatory Visit: Admitting: Dermatology

## 2024-08-27 ENCOUNTER — Encounter: Admitting: Internal Medicine
